# Patient Record
Sex: Male | Born: 1995
Health system: Southern US, Community
[De-identification: ages and names within clinical notes are randomized; demographics above are authoritative.]

---
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
## 2017-02-15 ENCOUNTER — Emergency Department
Admission: EM | Admit: 2017-02-15 | Discharge: 2017-02-16 | Disposition: A | Discharge status: Home / Self Care | Payer: Worker's Compensation | Attending: Emergency Medicine | Admitting: Emergency Medicine

## 2017-02-15 ENCOUNTER — Encounter: Payer: Self-pay | Admitting: *Deleted

## 2017-02-15 DIAGNOSIS — Y939 Activity, unspecified: Secondary | ICD-10-CM | POA: Diagnosis not present

## 2017-02-15 DIAGNOSIS — W231XXA Caught, crushed, jammed, or pinched between stationary objects, initial encounter: Secondary | ICD-10-CM | POA: Insufficient documentation

## 2017-02-15 DIAGNOSIS — Y999 Unspecified external cause status: Secondary | ICD-10-CM | POA: Insufficient documentation

## 2017-02-15 DIAGNOSIS — S67191A Crushing injury of left index finger, initial encounter: Secondary | ICD-10-CM | POA: Insufficient documentation

## 2017-02-15 DIAGNOSIS — S6710XA Crushing injury of unspecified finger(s), initial encounter: Secondary | ICD-10-CM

## 2017-02-15 DIAGNOSIS — Y929 Unspecified place or not applicable: Secondary | ICD-10-CM | POA: Insufficient documentation

## 2017-02-15 NOTE — ED Provider Notes (Signed)
[facility] Emergency Department Provider Note   ____________________________________________   First MD Initiated Contact with Patient 02/15/17 2359     (approximate)  I have reviewed the triage vital signs and the nursing notes.   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 Patient is right hand dominant.Complains of pain to his distal left index finger. Denies associated numbness/tingling. Patient voices no other complaints.   Past medical history None  There are no active problems to display for this patient.   No past surgical history on file.  Prior to Admission medications   Not on File    Allergies Patient has no known allergies.  No family history on file.  Social History Social History  Substance Use Topics  . Smoking status: Never Smoker  . Smokeless tobacco: Never Used  . Alcohol use No    Review of Systems  Constitutional: No fever/chills. Eyes: No visual changes. ENT: No sore throat. Cardiovascular: Denies chest pain. Respiratory: Denies shortness of breath. Gastrointestinal: No abdominal pain.  No nausea, no vomiting.  No diarrhea.  No constipation. Genitourinary: Negative for dysuria. Musculoskeletal: Positive for left finger pain. Negative for back pain. Skin: Negative for rash. Neurological: Negative for headaches, focal weakness or numbness.   ____________________________________________   PHYSICAL EXAM:  VITAL SIGNS: ED Triage Vitals  Enc Vitals Group     BP 02/15/17 2326 138/85     Pulse Rate 02/15/17 2326 67     Resp 02/15/17 2326 20     Temp 02/15/17 2326 98.6 F (37 C)     Temp Source 02/15/17 2326 Oral     SpO2 02/15/17 2326 99 %     Weight 02/15/17 2324 175 lb (79.4 kg)     Height 02/15/17 2324 6\' 1"  (1.854 m)   Head Circumference --      Peak Flow --      Pain Score 02/15/17 2323 4     Pain Loc --      Pain Edu? --      Excl. in GC? --     Constitutional: Alert and oriented. Well appearing and in no acute distress. Eyes: Conjunctivae are normal. PERRL. EOMI. Head: Atraumatic. Nose: No congestion/rhinnorhea. Mouth/Throat: Mucous membranes are moist.  Oropharynx non-erythematous. Neck: No stridor.  No cervical spine tenderness to palpation. Cardiovascular: Normal rate, regular rhythm. Grossly normal heart sounds.  Good peripheral circulation. Respiratory: Normal respiratory effort.  No retractions. Lungs CTAB. Gastrointestinal: Soft and nontender. No distention. No abdominal bruits. No CVA tenderness. Musculoskeletal:  Left index finger with slight reddening from distal IP joint to end of finger. Full range of motion with pain. Brisk, less than 5 second capillary refill. 2+ radial pulses. No nailbed injury or subungual hematoma. Neurologic:  Normal speech and language. No gross focal neurologic deficits are appreciated. No gait instability. Skin:  Skin is warm, dry and intact. No rash noted. Psychiatric: Mood and affect are normal. Speech and behavior are normal.  ____________________________________________   LABS (all labs ordered are listed, but only abnormal results are displayed)  Labs Reviewed - No data to display ____________________________________________  EKG  None ____________________________________________  RADIOLOGY  Left index finger x-ray interpreted per Dr. Cherly Hensenhang: No evidence of fracture or dislocation. ____________________________________________   PROCEDURES  Procedure(s) performed: None  Procedures  Critical  Care performed: No  ____________________________________________   INITIAL IMPRESSION / ASSESSMENT AND PLAN / ED COURSE  Pertinent labs & imaging results that were available during my care of the patient were reviewed by me and considered in my  medical decision making (see chart for details).  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 Will obtain x-ray and administer ibuprofen.  Clinical Course as of Feb 17 504  Thu Feb 16, 2017  [id number] Patient was updated on negative imaging results. Finger was splinted and he was discharged back to work in stable condition. Strict return precautions were given. Patient verbalized understanding and agreed with plan of care.  [JS]    Clinical Course User Index [JS] Irean Hong, MD     ____________________________________________   FINAL CLINICAL IMPRESSION(S) / ED DIAGNOSES  Final diagnoses:  Crushing injury of finger, initial encounter      NEW MEDICATIONS STARTED DURING THIS VISIT:  There are no discharge medications for this patient.    Note:  This document was prepared using Dragon voice recognition software and may include unintentional dictation errors.    Irean Hong, MD 02/16/17 [phone]
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
## 2017-02-16 ENCOUNTER — Emergency Department: Payer: Worker's Compensation

## 2017-02-16 MED ORDER — IBUPROFEN 800 MG PO TABS
800.0000 mg | ORAL_TABLET | Freq: Once | ORAL | Status: AC
  Administered 2017-02-16: 800 mg via ORAL
  Filled 2017-02-16: qty 1

## 2017-02-16 NOTE — Discharge Instructions (Signed)
Continue to wear splint as needed for comfort. Return to the ER for worsening symptoms, increased swelling or other concerns.

## 2017-05-03 ENCOUNTER — Emergency Department
Admission: EM | Admit: 2017-05-03 | Discharge: 2017-05-03 | Disposition: A | Discharge status: Home / Self Care | Payer: BLUE CROSS/BLUE SHIELD | Attending: Emergency Medicine | Admitting: Emergency Medicine

## 2017-05-03 ENCOUNTER — Encounter: Payer: Self-pay | Admitting: Emergency Medicine

## 2017-05-03 ENCOUNTER — Telehealth: Payer: Self-pay | Admitting: Emergency Medicine

## 2017-05-03 ENCOUNTER — Ambulatory Visit (HOSPITAL_COMMUNITY)
Admission: EM | Admit: 2017-05-03 | Discharge: 2017-05-03 | Disposition: A | Discharge status: Home / Self Care | Payer: No Typology Code available for payment source | Attending: Emergency Medicine | Admitting: Emergency Medicine

## 2017-05-03 DIAGNOSIS — Z0441 Encounter for examination and observation following alleged adult rape: Secondary | ICD-10-CM | POA: Insufficient documentation

## 2017-05-03 DIAGNOSIS — T7421XA Adult sexual abuse, confirmed, initial encounter: Secondary | ICD-10-CM

## 2017-05-03 DIAGNOSIS — T7621XA Adult sexual abuse, suspected, initial encounter: Secondary | ICD-10-CM | POA: Diagnosis present

## 2017-05-03 LAB — URINE DRUG SCREEN, QUALITATIVE (ARMC ONLY)
Amphetamines, Ur Screen: NOT DETECTED
Barbiturates, Ur Screen: NOT DETECTED
Benzodiazepine, Ur Scrn: NOT DETECTED
CANNABINOID 50 NG, UR ~~LOC~~: NOT DETECTED
COCAINE METABOLITE, UR ~~LOC~~: NOT DETECTED
MDMA (ECSTASY) UR SCREEN: NOT DETECTED
Methadone Scn, Ur: NOT DETECTED
Opiate, Ur Screen: NOT DETECTED
PHENCYCLIDINE (PCP) UR S: NOT DETECTED
Tricyclic, Ur Screen: NOT DETECTED

## 2017-05-03 MED ORDER — ONDANSETRON HCL 4 MG/2ML IJ SOLN
INTRAMUSCULAR | Status: AC
  Administered 2017-05-03: 4 mg via INTRAVENOUS
  Filled 2017-05-03: qty 2

## 2017-05-03 MED ORDER — SODIUM CHLORIDE 0.9 % IV BOLUS (SEPSIS)
1000.0000 mL | Freq: Once | INTRAVENOUS | Status: AC
Start: 2017-05-03 — End: 2017-05-03
  Administered 2017-05-03: 1000 mL via INTRAVENOUS

## 2017-05-03 MED ORDER — PROMETHAZINE HCL 25 MG PO TABS
25.0000 mg | ORAL_TABLET | Freq: Once | ORAL | Status: AC
  Administered 2017-05-03: 25 mg via ORAL
  Filled 2017-05-03: qty 1

## 2017-05-03 MED ORDER — ONDANSETRON HCL 4 MG/2ML IJ SOLN
4.0000 mg | Freq: Once | INTRAMUSCULAR | Status: AC
  Administered 2017-05-03: 4 mg via INTRAVENOUS

## 2017-05-03 MED ORDER — DIAZEPAM 5 MG PO TABS
5.0000 mg | ORAL_TABLET | Freq: Once | ORAL | Status: AC
  Administered 2017-05-03: 5 mg via ORAL
  Filled 2017-05-03: qty 1

## 2017-05-03 MED ORDER — IBUPROFEN 400 MG PO TABS
600.0000 mg | ORAL_TABLET | Freq: Once | ORAL | Status: AC
  Administered 2017-05-03: 600 mg via ORAL
  Filled 2017-05-03: qty 2

## 2017-05-03 MED ORDER — IBUPROFEN 400 MG PO TABS
ORAL_TABLET | ORAL | Status: AC
  Filled 2017-05-03: qty 1

## 2017-05-03 MED ORDER — ONDANSETRON HCL 4 MG/2ML IJ SOLN
INTRAMUSCULAR | Status: AC
  Filled 2017-05-03: qty 2

## 2017-05-03 NOTE — ED Notes (Signed)
[facility] done at bedside at this time. Pt states he feels like his HR is up at this time and c/o continued nausea. Pt requesting another liter of fluids.

## 2017-05-03 NOTE — ED Notes (Signed)
This RN to bedside with Sherilyn CooterHenry, RN. This RN introduced self to patient and family. Pt requesting something to drink and something for his headache. Pt noted to be resting in bed with family at bedside, respirations even and unlabored. This RN explained would speak with MD, pt states understanding. Will continue to monitor for further patient needs.
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
## 2017-05-03 NOTE — ED Notes (Signed)
Dr. Mayford KnifeWilliams to bedside at this time.
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
## 2017-05-03 NOTE — ED Notes (Signed)
SANE nurse is at bedside interviewing the Pt.

## 2017-05-03 NOTE — Telephone Encounter (Signed)
This RN notified by lab that incorrect tubes were drawn therefore all blood work that was sent down is unable to be run. Dr. Mayford KnifeWilliams notified, per Dr. Mayford KnifeWilliams, pt is to be contacted and given the option of returning for repeat lab draws. Will speak with Dixie DialsLiz G, RN regarding contacting patient to return for lab draws.
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
## 2017-05-03 NOTE — Telephone Encounter (Signed)
This RN attempted to call patient x 1 to return to ED for repeat blood work, left HIPAA compliant message on answering machine at [phone].
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
## 2017-05-16 LAB — BENZODIAZEPINES,MS,WB/SP RFX
7-Aminoclonazepam: NEGATIVE ng/mL
ALPRAZOLAM: NEGATIVE ng/mL
BENZODIAZEPINES CONFIRM: POSITIVE
CHLORDIAZEPOXIDE: NEGATIVE ng/mL
CLONAZEPAM: NEGATIVE ng/mL
DESMETHYLCHLORDIAZEPOXIDE: NEGATIVE ng/mL
Desalkylflurazepam: NEGATIVE ng/mL
Desmethyldiazepam: NEGATIVE ng/mL
Diazepam: 36 ng/mL
FLURAZEPAM: NEGATIVE ng/mL
LORAZEPAM: NEGATIVE ng/mL
Midazolam: NEGATIVE ng/mL
OXAZEPAM: NEGATIVE ng/mL
TEMAZEPAM: NEGATIVE ng/mL
Triazolam: NEGATIVE ng/mL

## 2017-05-16 LAB — DRUG SCREEN 10 W/CONF, SERUM
Amphetamines, IA: NEGATIVE ng/mL
Barbiturates, IA: NEGATIVE ug/mL
Benzodiazepines, IA: POSITIVE ng/mL
COCAINE & METABOLITE, IA: NEGATIVE ng/mL
Methadone, IA: NEGATIVE ng/mL
OXYCODONES, IA: NEGATIVE ng/mL
Opiates, IA: NEGATIVE ng/mL
PROPOXYPHENE, IA: NEGATIVE ng/mL
Phencyclidine, IA: NEGATIVE ng/mL
THC(Marijuana) Metabolite, IA: NEGATIVE ng/mL

## 2017-06-08 ENCOUNTER — Emergency Department
Admission: EM | Admit: 2017-06-08 | Discharge: 2017-06-08 | Disposition: A | Discharge status: Home / Self Care | Payer: Worker's Compensation | Attending: Emergency Medicine | Admitting: Emergency Medicine

## 2017-06-08 DIAGNOSIS — Z7721 Contact with and (suspected) exposure to potentially hazardous body fluids: Secondary | ICD-10-CM | POA: Diagnosis not present

## 2017-06-08 DIAGNOSIS — W461XXA Contact with contaminated hypodermic needle, initial encounter: Secondary | ICD-10-CM

## 2017-06-08 DIAGNOSIS — W460XXA Contact with hypodermic needle, initial encounter: Secondary | ICD-10-CM | POA: Diagnosis not present

## 2017-06-08 DIAGNOSIS — W268XXA Contact with other sharp object(s), not elsewhere classified, initial encounter: Secondary | ICD-10-CM | POA: Insufficient documentation

## 2017-06-08 NOTE — ED Provider Notes (Signed)
[facility] Emergency Department Provider Note   First MD Initiated Contact with Patient 06/08/17 [phone]     (approximate)  I have reviewed the triage vital signs and the nursing notes.   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 Patient was stuck on the left thumb   Past medical history None There are no active problems to display for this patient.   Past Surgical History None   Prior to Admission medications   Not on File    Allergies No Known Drug Allergies History reviewed. No pertinent family history.  Social History Social History  Substance Use Topics  . Smoking status: Never Smoker  . Smokeless tobacco: Never Used  . Alcohol use No    Review of Systems Constitutional: No fever/chills Eyes: No visual changes. ENT: No sore throat. Cardiovascular: Denies chest pain. Respiratory: Denies shortness of breath. Gastrointestinal: No abdominal pain.  No nausea, no vomiting.  No diarrhea.  No constipation. Genitourinary: Negative for dysuria. Musculoskeletal: Negative for neck pain.  Negative for back pain. Integumentary: Negative for rash. Neurological: Negative for headaches, focal weakness or numbness.   ____________________________________________   PHYSICAL EXAM:  VITAL SIGNS: ED Triage Vitals  Enc Vitals Group     BP 06/08/17 0203 (!) 127/92     Pulse Rate 06/08/17 0203 70     Resp 06/08/17 0203 18     Temp 06/08/17 0203 (!) 97.5 F (36.4 C)     Temp Source 06/08/17 0203 Oral     SpO2 06/08/17 0203 96 %     Weight --      Height --      Head Circumference --      Peak Flow --      Pain Score 06/08/17 0201 0     Pain Loc --      Pain Edu? --    Constitutional: Alert and oriented. Well appearing and in no acute  distress. Eyes: Conjunctivae are normal.  Head: Atraumatic. Mouth/Throat: Mucous membranes are moist.  Oropharynx non-erythematous. Neck: No stridor.  Cardiovascular: Normal rate, regular rhythm. Good peripheral circulation. Grossly normal heart sounds. Respiratory: Normal respiratory effort.  No retractions. Lungs CTAB. Musculoskeletal: No lower extremity tenderness nor edema. No gross deformities of extremities. Neurologic:  Normal speech and language. No gross focal neurologic deficits are appreciated.  Skin:  Skin is warm, dry and intact. No rash noted.     Procedures   ____________________________________________   INITIAL IMPRESSION / ASSESSMENT AND PLAN / ED COURSE  Pertinent labs & imaging results that were available during my care of the patient were reviewed by me and considered in my medical decision making (see chart for details).   spoke with the patient at length regarding infectious disease transmission via needle stick. Reviewed the owner of the needles rapid HIV which was negative as well as hepatitis C that was negative and 2016. Patient decided to forego prophylaxis at this time.     ____________________________________________  FINAL CLINICAL IMPRESSION(S) / ED DIAGNOSES  Final diagnoses:  Accidental needlestick injury with exposure to body fluid     MEDICATIONS GIVEN DURING THIS VISIT:  Medications - No data to display   NEW OUTPATIENT MEDICATIONS STARTED DURING THIS VISIT:  New Prescriptions   No medications on file    Modified Medications  No medications on file    Discontinued Medications   No medications on file     Note:  This document was prepared using Dragon voice recognition software and may include unintentional dictation errors.    Darci Current, MD 06/09/17 [phone]

## 2017-06-08 NOTE — ED Notes (Signed)
Lab [employer] urine collected for UDS per nursing supervisor request (degated to this tech via Press photographer) Signed out to [facility] lab following chain of custody protocol

## 2017-06-08 NOTE — ED Triage Notes (Signed)
W/C. 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

## 2017-06-08 NOTE — ED Notes (Signed)
Patient discharged to home per MD order. Patient in stable condition, and deemed medically cleared by ED provider for discharge. Discharge instructions reviewed with patient/family using "Teach Back"; verbalized understanding of medication education and administration, and information about follow-up care. Denies further concerns. ° °

## 2017-10-23 ENCOUNTER — Other Ambulatory Visit: Payer: Self-pay

## 2017-10-23 ENCOUNTER — Emergency Department
Admission: EM | Admit: 2017-10-23 | Discharge: 2017-10-24 | Disposition: A | Discharge status: Home / Self Care | Payer: BLUE CROSS/BLUE SHIELD | Attending: Emergency Medicine | Admitting: Emergency Medicine

## 2017-10-23 DIAGNOSIS — K529 Noninfective gastroenteritis and colitis, unspecified: Secondary | ICD-10-CM

## 2017-10-23 DIAGNOSIS — R112 Nausea with vomiting, unspecified: Secondary | ICD-10-CM | POA: Diagnosis present

## 2017-10-23 LAB — URINALYSIS, COMPLETE (UACMP) WITH MICROSCOPIC
Bacteria, UA: NONE SEEN
Bilirubin Urine: NEGATIVE
Glucose, UA: NEGATIVE mg/dL
Hgb urine dipstick: NEGATIVE
Ketones, ur: NEGATIVE mg/dL
Leukocytes, UA: NEGATIVE
Nitrite: NEGATIVE
PH: 7 (ref 5.0–8.0)
PROTEIN: NEGATIVE mg/dL
SQUAMOUS EPITHELIAL / LPF: NONE SEEN
Specific Gravity, Urine: 1.008 (ref 1.005–1.030)

## 2017-10-23 LAB — CBC
HCT: 39.9 % — ABNORMAL LOW (ref 40.0–52.0)
Hemoglobin: 13 g/dL (ref 13.0–18.0)
MCH: 26.5 pg (ref 26.0–34.0)
MCHC: 32.6 g/dL (ref 32.0–36.0)
MCV: 81.1 fL (ref 80.0–100.0)
PLATELETS: 173 10*3/uL (ref 150–440)
RBC: 4.92 MIL/uL (ref 4.40–5.90)
RDW: 12.7 % (ref 11.5–14.5)
WBC: 4 10*3/uL (ref 3.8–10.6)

## 2017-10-23 LAB — COMPREHENSIVE METABOLIC PANEL
ALBUMIN: 3.5 g/dL (ref 3.5–5.0)
ALK PHOS: 36 U/L — AB (ref 38–126)
ALT: 15 U/L — ABNORMAL LOW (ref 17–63)
AST: 18 U/L (ref 15–41)
Anion gap: 5 (ref 5–15)
BUN: 9 mg/dL (ref 6–20)
CALCIUM: 8.1 mg/dL — AB (ref 8.9–10.3)
CHLORIDE: 105 mmol/L (ref 101–111)
CO2: 29 mmol/L (ref 22–32)
CREATININE: 0.85 mg/dL (ref 0.61–1.24)
GFR calc Af Amer: >60 mL/min (ref >60)
GFR calc non Af Amer: >60 mL/min (ref >60)
GLUCOSE: 113 mg/dL — AB (ref 65–99)
Potassium: 3.6 mmol/L (ref 3.5–5.1)
SODIUM: 139 mmol/L (ref 135–145)
Total Bilirubin: 0.3 mg/dL (ref 0.3–1.2)
Total Protein: 6 g/dL — ABNORMAL LOW (ref 6.5–8.1)

## 2017-10-23 LAB — LIPASE, BLOOD: LIPASE: 24 U/L (ref 11–51)

## 2017-10-23 MED ORDER — PROMETHAZINE HCL 25 MG/ML IJ SOLN
INTRAMUSCULAR | Status: AC
  Filled 2017-10-23: qty 1

## 2017-10-23 MED ORDER — SODIUM CHLORIDE 0.9 % IV BOLUS (SEPSIS)
1000.0000 mL | Freq: Once | INTRAVENOUS | Status: AC
  Administered 2017-10-23: 1000 mL via INTRAVENOUS

## 2017-10-23 MED ORDER — PROMETHAZINE HCL 25 MG/ML IJ SOLN
25.0000 mg | Freq: Once | INTRAMUSCULAR | Status: AC
  Administered 2017-10-23: 25 mg via INTRAVENOUS

## 2017-10-23 MED ORDER — ONDANSETRON HCL 4 MG/2ML IJ SOLN
4.0000 mg | Freq: Once | INTRAMUSCULAR | Status: AC
  Administered 2017-10-23: 4 mg via INTRAVENOUS
  Filled 2017-10-23: qty 2

## 2017-10-23 MED ORDER — PROMETHAZINE HCL 25 MG PO TABS: 25.0000 mg | ORAL_TABLET | Freq: Four times a day (QID) | ORAL | 0 refills | Status: DC | PRN

## 2017-10-23 MED ORDER — ONDANSETRON 4 MG PO TBDP: 4.0000 mg | ORAL_TABLET | Freq: Three times a day (TID) | ORAL | 0 refills | Status: DC | PRN

## 2017-10-23 NOTE — ED Triage Notes (Signed)
Pt arrives to ED via [facility] with c/o of N&V&D. Started over the weekend. Had chickfila and about 6 hours later symptoms began. [place] in [place] in [place] and given 2 L NS and was DC. Was given immodium and phenergan but symptoms persist. Pt alert, oriented, appears fatigued. Pt states lost 12 lbs since Saturday when symptoms began.

## 2017-10-23 NOTE — ED Provider Notes (Signed)
[facility] Emergency Department Provider Note  Time seen: 10:10 PM  I have reviewed the triage vital signs and the nursing notes.   HISTORY  Chief Complaint Emesis and Diarrhea    HPI Jacob Stewart is a 22 y.o. male with no past medical history who presents to the emergency department for nausea vomiting diarrhea.  According to the patient 4 days ago he developed nausea vomiting diarrhea.  States he continues to feel nauseated but the vomiting is largely resolved but he continues to have extremely frequent episodes of very loose watery diarrhea.  Patient taking loperamide at home with minimal relief.  States abdominal cramping but denies any focal pain.  States 3 days ago he did have a fever to 101, does not believe he has had a fever since.  Patient states generalized fatigue and weakness feels like he is very dehydrated.  History reviewed. No pertinent past medical history.  There are no active problems to display for this patient.   History reviewed. No pertinent surgical history.  Prior to Admission medications   Not on File    No Known Allergies  History reviewed. No pertinent family history.  Social History Social History   Tobacco Use  . Smoking status: Never Smoker  . Smokeless tobacco: Never Used  Substance Use Topics  . Alcohol use: Yes  . Drug use: No    Review of Systems Constitutional: Fever 3 days ago. Eyes: Negative for visual complaints ENT: Negative for recent illness/congestion Cardiovascular: Negative for chest pain. Respiratory: Negative for shortness of breath. Gastrointestinal: Abdominal cramping.  Denies focal pain.  Positive for nausea vomiting diarrhea.  Watery diarrhea.  Denies blood. Genitourinary: Negative for urinary compaints Musculoskeletal: Negative for musculoskeletal complaints Skin: Negative for skin complaints  Neurological: Negative for headache All other ROS  negative  ____________________________________________   PHYSICAL EXAM:  VITAL SIGNS: ED Triage Vitals [10/23/17 2152]  Enc Vitals Group     BP 129/83     Pulse Rate 76     Resp 16     Temp 98.3 F (36.8 C)     Temp Source Oral     SpO2 98 %     Weight 172 lb (78 kg)     Height 6\' 1"  (1.854 m)     Head Circumference      Peak Flow      Pain Score 0     Pain Loc      Pain Edu?      Excl. in GC?    Constitutional: Alert and oriented.  Appears somewhat nauseated. Eyes: Normal exam ENT   Head: Normocephalic and atraumatic.   Mouth/Throat: Dry mucous membranes. Cardiovascular: Normal rate, regular rhythm. No murmur Respiratory: Normal respiratory effort without tachypnea nor retractions. Breath sounds are clear  Gastrointestinal: Soft, mild tenderness/cramping throughout.  No focal area of tenderness identified.  No rebound or guarding.  No distention. Musculoskeletal: Nontender with normal range of motion in all extremities.  Neurologic:  Normal speech and language. No gross focal neurologic deficits Skin:  Skin is warm, dry and intact.  Psychiatric: Mood and affect are normal.   ____________________________________________   INITIAL IMPRESSION / ASSESSMENT AND PLAN / ED COURSE  Pertinent labs & imaging results that were available during my care of the patient were reviewed by me and considered in my medical decision making (see chart for details).  Patient presents emergency department for nausea vomiting diarrhea ongoing for 4 days.  States the vomiting is largely resolved  but he continues to have very frequent episodes of watery diarrhea.  Differential would include gastroenteritis, viral gastroenteritis, bacterial gastroenteritis, C. difficile, colitis/diverticulitis.  We will check labs, IV hydrate, treat with Zofran and continue to closely monitor in the emergency department.  Will obtain a stool sample to check for C. 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  Patient cannot produce a stool sample in the emergency department.  Patient's labs are surprisingly reassuring, normal anion gap, no white blood cell count elevation, no ketones within the urine.  We will discharged with nausea medication, I discussed continuing to use loperamide 2 mg tablets after each loose bowel movement up to 4 times in a 24-hour period.  I also discussed supportive care at home including plenty of fluids, Tylenol or ibuprofen for discomfort and nausea medication as needed, as written for nausea.  Patient agreeable to this plan of care.  ____________________________________________   FINAL CLINICAL IMPRESSION(S) / ED DIAGNOSES  Nausea vomiting diarrhea Gastroenteritis   Minna AntisPaduchowski, Marylene Masek, MD 10/23/17 [phone]

## 2017-10-23 NOTE — ED Notes (Signed)
Pt given ginger ale, ok per Dr. Lenard LancePaduchowski.

## 2017-10-24 NOTE — ED Notes (Signed)
Pt taken to POV in wheelchair. VSS. NAD. Discharge instructions, RX, and follow up reviewed. All questions answered.

## 2018-06-05 IMAGING — DX DG FINGER INDEX 2+V*L*
3 series · 3 of 3 positions shown · non-contrast
Comparison: None.

CLINICAL DATA: Left index finger pain after mashing it between
stretcher and stretcher mount. Initial encounter.

EXAM:
LEFT INDEX FINGER 2+V

[finger ap]
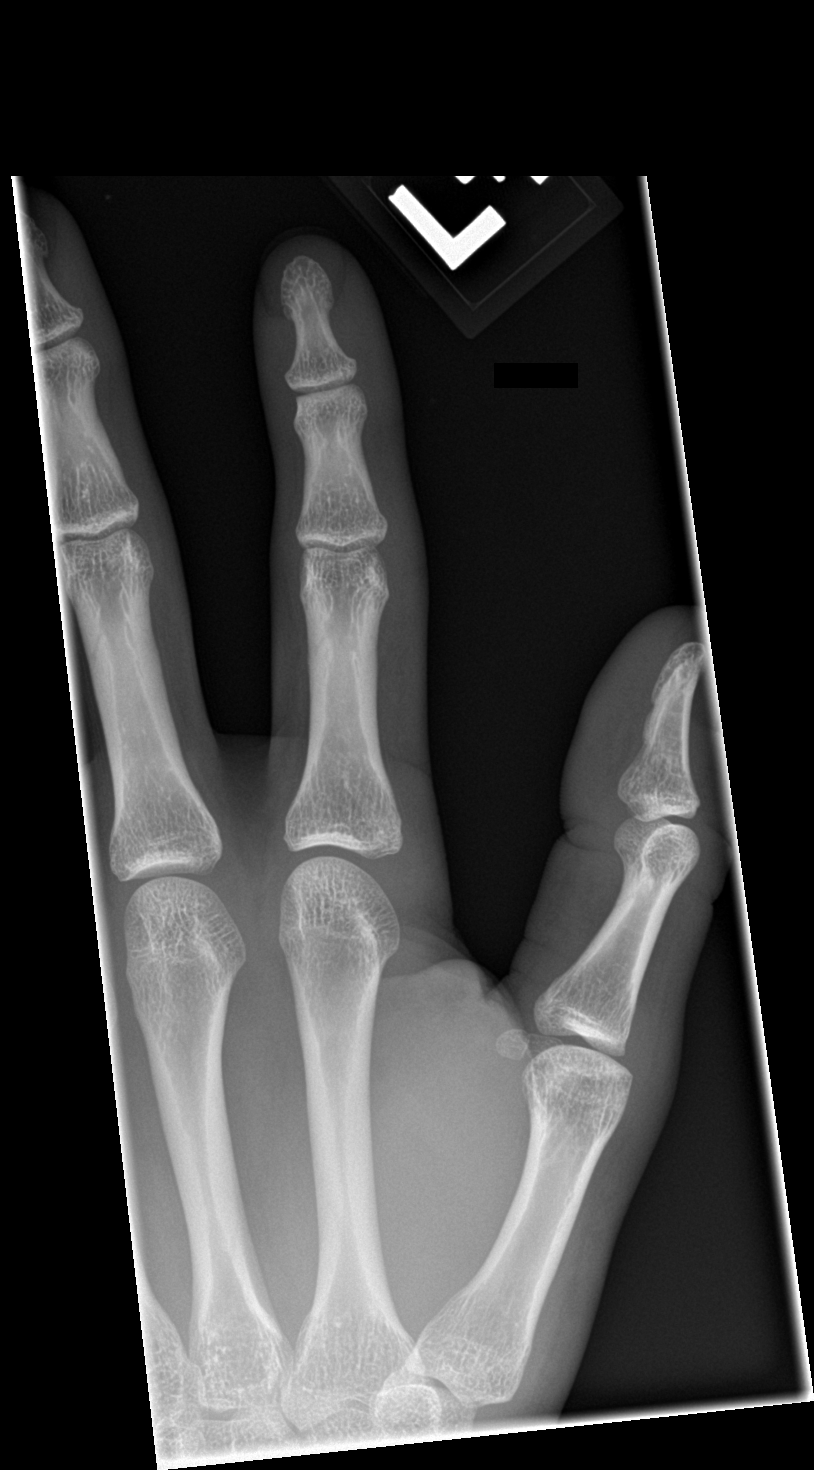

[finger obl]
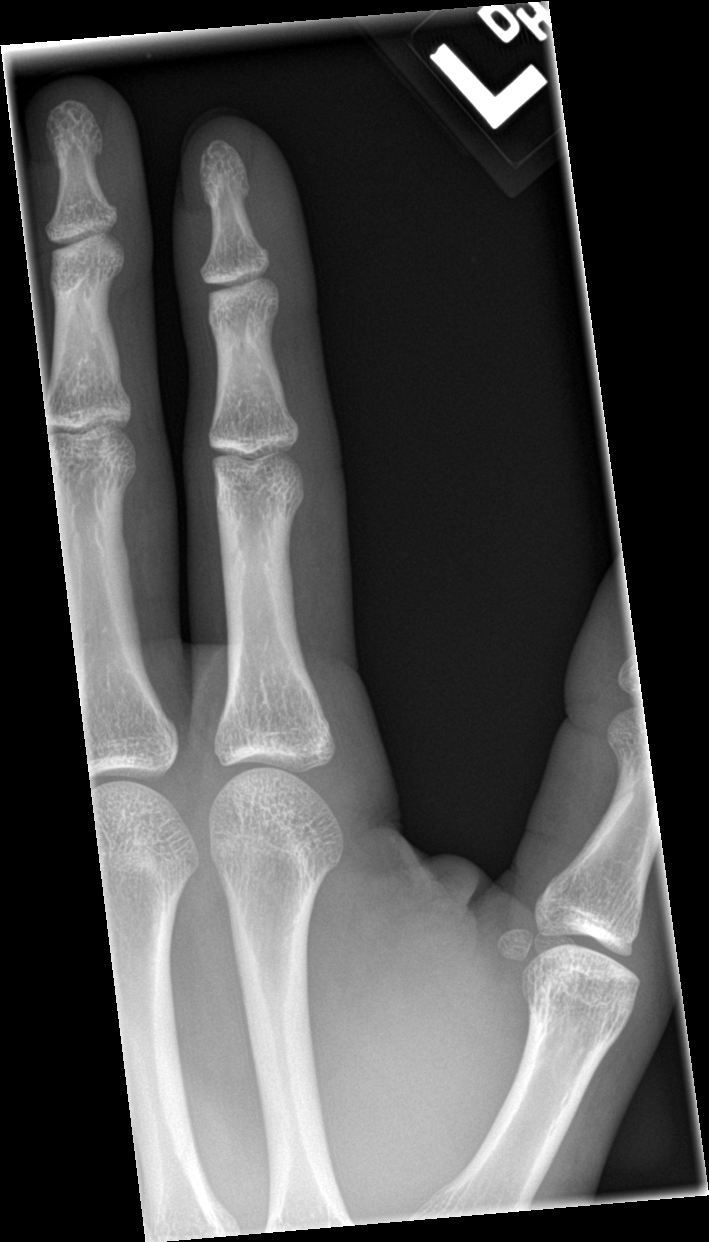

[finger lat]
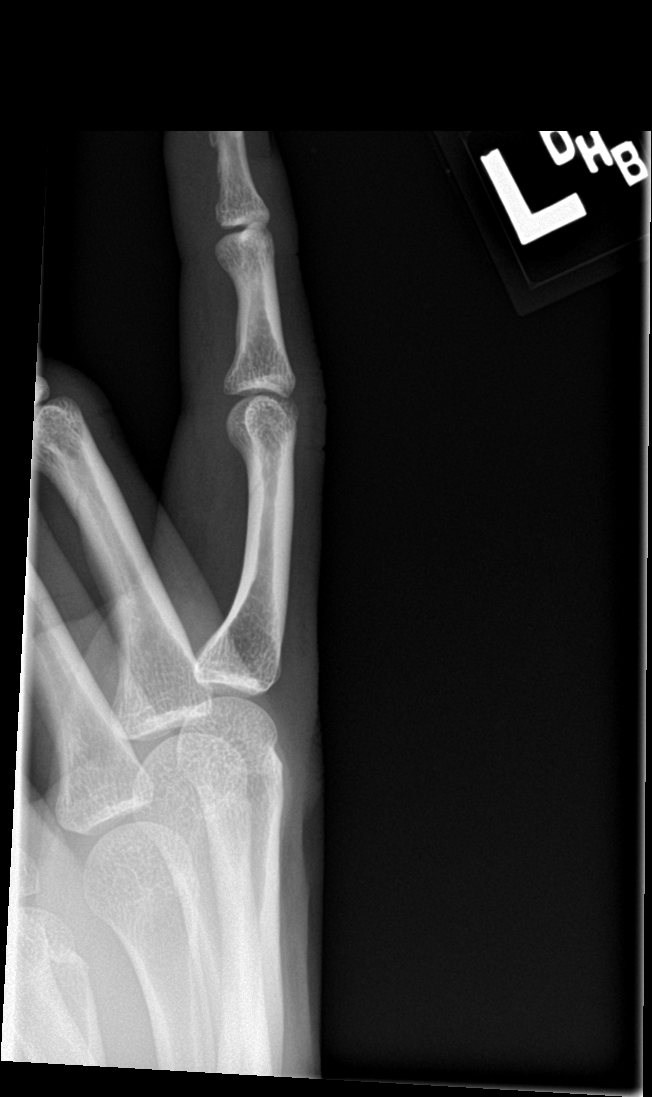

[3 of 3 positions shown; findings below may reference images not displayed]

FINDINGS: There is no evidence of fracture or dislocation. Visualized joint
spaces are preserved. No definite soft tissue abnormalities are
characterized on radiograph.
IMPRESSION: No evidence of fracture or dislocation.

## 2019-07-31 ENCOUNTER — Ambulatory Visit: Payer: Self-pay | Admitting: Family Medicine

## 2019-08-02 ENCOUNTER — Encounter: Payer: Self-pay | Admitting: Family Medicine

## 2019-08-02 ENCOUNTER — Other Ambulatory Visit: Payer: Self-pay

## 2019-08-02 ENCOUNTER — Ambulatory Visit: Payer: BC Managed Care – PPO | Admitting: Family Medicine

## 2019-08-02 VITALS — BP 110/76 | HR 72 | Temp 97.8°F | Resp 16 | Ht 72.0 in | Wt 175.9 lb

## 2019-08-02 DIAGNOSIS — Z23 Encounter for immunization: Secondary | ICD-10-CM | POA: Diagnosis not present

## 2019-08-02 DIAGNOSIS — F341 Dysthymic disorder: Secondary | ICD-10-CM | POA: Diagnosis not present

## 2019-08-02 DIAGNOSIS — Z7689 Persons encountering health services in other specified circumstances: Secondary | ICD-10-CM | POA: Diagnosis not present

## 2019-08-02 MED ORDER — ESCITALOPRAM OXALATE 5 MG PO TABS: ORAL_TABLET | ORAL | 1 refills | Status: AC

## 2019-08-02 NOTE — Addendum Note (Signed)
Addended by: Sia Gabrielsen G on: 08/02/2019 11:21 AM   Modules accepted: Orders

## 2019-08-02 NOTE — Progress Notes (Signed)
Name: Jacob Stewart   MRN: [id number]    DOB: 1996/08/26   Date:08/02/2019       Progress Note  Subjective  Chief Complaint  Chief Complaint  Patient presents with  . Establish Care    HPI  Pt presents to establish care and for the following:  Social: Was seeing Dr. Marolyn Haller with [facility] but he retired. He works Educational psychologist for [employer]  Lives in apartment with a friend who also works Educational psychologist.  No pets. He is planning to go to med school - has BA is exercise science BA in psychology and minor in [place] History.   Dysthymia: He has been having similar symptoms last year after graduating from [employer]. Struggled with the change in social structure from school to working. Also studying for MCAT.  Does have family history of depression - mother, younger brother.  Denies SI/HI.  Has used the EAP with [facility] in the past to work through some traumatic work incidents.  Is trying to go to the gym, taking care of his stress levels more proactively.  Diet is more balanced, staying hydrated.  His mom did not do well on SSRI therapy in the past, so he is hesitant to take SSRI therapy. No panic attacks, but does have higher anxiety days.  Patient Active Problem List   Diagnosis Date Noted  . Dysthymia 08/02/2019    History reviewed. No pertinent surgical history.  Family History  Problem Relation Age of Onset  . Depression Mother   . Hypertension Mother   . Diabetes type II Mother   . Hyperlipidemia Mother   . Depression Brother   . Healthy Father   . Hypertension Maternal Grandmother   . Hyperlipidemia Maternal Grandmother   . Diabetes type II Maternal Grandmother   . Healthy Brother     Social History   Socioeconomic History  . Marital status: Single    Spouse name: Not on file  . Number of children: Not on file  . Years of education: 16 years  . Highest education level: Not on file  Occupational History  . Not on file  Social Needs  . Financial resource strain:  Not hard at all  . Food insecurity    Worry: Never true    Inability: Never true  . Transportation needs    Medical: No    Non-medical: No  Tobacco Use  . Smoking status: Never Smoker  . Smokeless tobacco: Never Used  Substance and Sexual Activity  . Alcohol use: Yes  . Drug use: No  . Sexual activity: Not Currently    Partners: Male  Lifestyle  . Physical activity    Days per week: 4 days    Minutes per session: 60 min  . Stress: To some extent  Relationships  . Social connections    Talks on phone: More than three times a week    Gets together: Three times a week    Attends religious service: More than 4 times per year    Active member of club or organization: Yes    Attends meetings of clubs or organizations: More than 4 times per year    Relationship status: Never married  . Intimate partner violence    Fear of current or ex partner: No    Emotionally abused: No    Physically abused: No    Forced sexual activity: No  Other Topics Concern  . Not on file  Social History Narrative   [employer] for  med school pre requisitions in 2009. AT [employer] Taking online classes   Patient currently a Paramedic     Current Outpatient Medications:  Marland Kitchen  Melatonin 5 MG CAPS, Take by mouth., Disp: , Rfl:  .  escitalopram (LEXAPRO) 5 MG tablet, Take 1/2 tablet once daily for 7 days, then increase to 1 tablet once daily., Disp: 30 tablet, Rfl: 1  No Known Allergies  I personally reviewed active problem list, medication list, allergies, health maintenance, notes from last encounter, lab results with the patient/caregiver today.   ROS  Constitutional: Negative for fever or weight change.  Respiratory: Negative for cough and shortness of breath.   Cardiovascular: Negative for chest pain or palpitations.  Gastrointestinal: Negative for abdominal pain, no bowel changes.  Musculoskeletal: Negative for gait problem or joint swelling.  Skin: Negative for rash.  Neurological: Negative for  dizziness or headache.  No other specific complaints in a complete review of systems (except as listed in HPI above).  Objective  Vitals:   08/02/19 1031  BP: 110/76  Pulse: 72  Resp: 16  Temp: 97.8 F (36.6 C)  TempSrc: Temporal  SpO2: 99%  Weight: 175 lb 14.4 oz (79.8 kg)  Height: 6' (1.829 m)    Body mass index is 23.86 kg/m.  Physical Exam  Constitutional: Patient appears well-developed and well-nourished. No distress.  HENT: Head: Normocephalic and atraumatic.  Eyes: Conjunctivae and EOM are normal. No scleral icterus. Neck: Normal range of motion. Neck supple. No JVD present. No thyromegaly present.  Cardiovascular: Normal rate, regular rhythm and normal heart sounds.  No murmur heard. No BLE edema. Pulmonary/Chest: Effort normal and breath sounds normal. No respiratory distress. Musculoskeletal: Normal range of motion, no joint effusions. No gross deformities Neurological: Pt is alert and oriented to person, place, and time. No cranial nerve deficit. Coordination, balance, strength, speech and gait are normal.  Skin: Skin is warm and dry. No rash noted. No erythema.  Psychiatric: Patient has a normal mood and affect. behavior is normal. Judgment and thought content normal.  No results found for this or any previous visit (from the past 72 hour(s)).  PHQ2/9: Depression screen PHQ 2/9 08/02/2019  Decreased Interest 1  Down, Depressed, Hopeless 1  PHQ - 2 Score 2  Altered sleeping 3  Tired, decreased energy 1  Change in appetite 0  Feeling bad or failure about yourself  0  Trouble concentrating 1  Moving slowly or fidgety/restless 0  Suicidal thoughts 0  PHQ-9 Score 7  Difficult doing work/chores Not difficult at all   PHQ-2/9 Result is positive.    Fall Risk: Fall Risk  08/02/2019  Falls in the past year? 0  Number falls in past yr: 0  Injury with Fall? 0  Follow up Falls evaluation completed   Assessment & Plan  1. Dysthymia - escitalopram  (LEXAPRO) 5 MG tablet; Take 1/2 tablet once daily for 7 days, then increase to 1 tablet once daily.  Dispense: 30 tablet; Refill: 1  2. Encounter to establish care

## 2019-08-20 ENCOUNTER — Other Ambulatory Visit: Payer: Self-pay

## 2019-08-20 ENCOUNTER — Ambulatory Visit (INDEPENDENT_AMBULATORY_CARE_PROVIDER_SITE_OTHER): Payer: BC Managed Care – PPO | Admitting: Family Medicine

## 2019-08-20 ENCOUNTER — Encounter: Payer: Self-pay | Admitting: Family Medicine

## 2019-08-20 VITALS — BP 112/68 | HR 77 | Temp 97.8°F | Resp 18 | Ht 72.0 in | Wt 170.1 lb

## 2019-08-20 DIAGNOSIS — Z833 Family history of diabetes mellitus: Secondary | ICD-10-CM | POA: Diagnosis not present

## 2019-08-20 DIAGNOSIS — Z83438 Family history of other disorder of lipoprotein metabolism and other lipidemia: Secondary | ICD-10-CM

## 2019-08-20 DIAGNOSIS — Z113 Encounter for screening for infections with a predominantly sexual mode of transmission: Secondary | ICD-10-CM

## 2019-08-20 DIAGNOSIS — Z131 Encounter for screening for diabetes mellitus: Secondary | ICD-10-CM | POA: Diagnosis not present

## 2019-08-20 DIAGNOSIS — Z1159 Encounter for screening for other viral diseases: Secondary | ICD-10-CM

## 2019-08-20 DIAGNOSIS — Z0000 Encounter for general adult medical examination without abnormal findings: Secondary | ICD-10-CM | POA: Diagnosis not present

## 2019-08-20 DIAGNOSIS — Z13220 Encounter for screening for lipoid disorders: Secondary | ICD-10-CM

## 2019-08-20 NOTE — Patient Instructions (Signed)
Preventive Care 19-23 Years Old, Male Preventive care refers to lifestyle choices and visits with your health care provider that can promote health and wellness. This includes:  A yearly physical exam. This is also called an annual well check.  Regular dental and eye exams.  Immunizations.  Screening for certain conditions.  Healthy lifestyle choices, such as eating a healthy diet, getting regular exercise, not using drugs or products that contain nicotine and tobacco, and limiting alcohol use. What can I expect for my preventive care visit? Physical exam Your health care provider will check:  Height and weight. These may be used to calculate body mass index (BMI), which is a measurement that tells if you are at a healthy weight.  Heart rate and blood pressure.  Your skin for abnormal spots. Counseling Your health care provider may ask you questions about:  Alcohol, tobacco, and drug use.  Emotional well-being.  Home and relationship well-being.  Sexual activity.  Eating habits.  Work and work Statistician. What immunizations do I need?  Influenza (flu) vaccine  This is recommended every year. Tetanus, diphtheria, and pertussis (Tdap) vaccine  You may need a Td booster every 10 years. Varicella (chickenpox) vaccine  You may need this vaccine if you have not already been vaccinated. Human papillomavirus (HPV) vaccine  If recommended by your health care provider, you may need three doses over 6 months. Measles, mumps, and rubella (MMR) vaccine  You may need at least one dose of MMR. You may also need a second dose. Meningococcal conjugate (MenACWY) vaccine  One dose is recommended if you are 45-76 years old and a Market researcher living in a residence hall, or if you have one of several medical conditions. You may also need additional booster doses. Pneumococcal conjugate (PCV13) vaccine  You may need this if you have certain conditions and were not  previously vaccinated. Pneumococcal polysaccharide (PPSV23) vaccine  You may need one or two doses if you smoke cigarettes or if you have certain conditions. Hepatitis A vaccine  You may need this if you have certain conditions or if you travel or work in places where you may be exposed to hepatitis A. Hepatitis B vaccine  You may need this if you have certain conditions or if you travel or work in places where you may be exposed to hepatitis B. Haemophilus influenzae type b (Hib) vaccine  You may need this if you have certain risk factors. You may receive vaccines as individual doses or as more than one vaccine together in one shot (combination vaccines). Talk with your health care provider about the risks and benefits of combination vaccines. What tests do I need? Blood tests  Lipid and cholesterol levels. These may be checked every 5 years starting at age 17.  Hepatitis C test.  Hepatitis B test. Screening   Diabetes screening. This is done by checking your blood sugar (glucose) after you have not eaten for a while (fasting).  Sexually transmitted disease (STD) testing. Talk with your health care provider about your test results, treatment options, and if necessary, the need for more tests. Follow these instructions at home: Eating and drinking   Eat a diet that includes fresh fruits and vegetables, whole grains, lean protein, and low-fat dairy products.  Take vitamin and mineral supplements as recommended by your health care provider.  Do not drink alcohol if your health care provider tells you not to drink.  If you drink alcohol: ? Limit how much you have to 0-2  drinks a day. ? Be aware of how much alcohol is in your drink. In the [place] one drink equals one 12 oz bottle of beer (355 mL), one 5 oz glass of wine (148 mL), or one 1 oz glass of hard liquor (44 mL). Lifestyle  Take daily care of your teeth and gums.  Stay active. Exercise for at least 30 minutes on 5 or  more days each week.  Do not use any products that contain nicotine or tobacco, such as cigarettes, e-cigarettes, and chewing tobacco. If you need help quitting, ask your health care provider.  If you are sexually active, practice safe sex. Use a condom or other form of protection to prevent STIs (sexually transmitted infections). What's next?  Go to your health care provider once a year for a well check visit.  Ask your health care provider how often you should have your eyes and teeth checked.  Stay up to date on all vaccines. This information is not intended to replace advice given to you by your health care provider. Make sure you discuss any questions you have with your health care provider. Document Released: 11/01/2001 Document Revised: 08/30/2018 Document Reviewed: 08/30/2018 Elsevier Patient Education  2020 [employer]   

## 2019-08-20 NOTE — Progress Notes (Signed)
Name: Jacob Stewart   MRN: [id number]    DOB: 1996-05-10   Date:08/20/2019       Progress Note  Subjective  Chief Complaint  Chief Complaint  Patient presents with  . Annual Exam    HPI  Patient presents for annual CPE and brief follow up.  Dysthymia: Was started on low dose 5mg  Lexapro about 2 weeks prior.  He works Neurosurgeon for [employer], is studying for [employer], has also struggled with the social changes after completing his undergraduate degree.  He has healthy diet, exercises regularly, does not have panic attacks.  He reports having had nausea initially, but this has subsided.  He is taking the medication at night because it has been making him a bit drowsy.     Office Visit from 08/20/2019 in [facility]  PHQ-9 Total Score  5     USPSTF grade A and B recommendations:  Diet: Balanced Exercise: Exercissing regularly  Depression: phq 9 is positive Depression screen [facility] 2/9 08/20/2019 08/02/2019  Decreased Interest 1 1  Down, Depressed, Hopeless 1 1  PHQ - 2 Score 2 2  Altered sleeping 1 3  Tired, decreased energy 2 1  Change in appetite 0 0  Feeling bad or failure about yourself  0 0  Trouble concentrating 0 1  Moving slowly or fidgety/restless 0 0  Suicidal thoughts 0 0  PHQ-9 Score 5 7  Difficult doing work/chores Not difficult at all Not difficult at all    Hypertension:  BP Readings from Last 3 Encounters:  08/20/19 112/68  08/02/19 110/76  10/24/17 132/72    Obesity: Wt Readings from Last 3 Encounters:  08/20/19 170 lb 1.6 oz (77.2 kg)  08/02/19 175 lb 14.4 oz (79.8 kg)  10/23/17 172 lb (78 kg)   BMI Readings from Last 3 Encounters:  08/20/19 23.07 kg/m  08/02/19 23.86 kg/m  10/23/17 22.69 kg/m     Lipids:  No results found for: CHOL No results found for: HDL No results found for: LDLCALC No results found for: TRIG No results found for: CHOLHDL No results found for: LDLDIRECT Glucose:  Glucose  Date Value Ref Range  Status  09/28/2013 100 (H) 65 - 99 mg/dL Final   Glucose, Bld  Date Value Ref Range Status  10/23/2017 113 (H) 65 - 99 mg/dL Final      Office Visit from 08/20/2019 in [facility]  AUDIT-C Score  1    Occasionally - has a beer with dinner sometimes.  Single STD testing and prevention (HIV/chl/gon/syphilis): Has had 1 partner in the last year.  We will check today Hep C: We will check today  Skin cancer: No concerning lesions; discussed monitoring for atypical lesions. Colorectal cancer: Denies family or personal history of colorectal cancer, no changes in BM's - no blood in stool, dark and tarry stool, mucus in stool, or constipation/diarrhea. Prostate cancer: Paternal grandfather in his late 73's. Testicular Cancer: No concerns for abnormalities, no family history.   Lung cancer:  Never smoker. Low Dose CT Chest recommended if Age 42-80 years, 30 pack-year currently smoking OR have quit w/in 15years. Patient does not qualify.   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  Denies chest pain, shortness of breath, or palpitations.  Advanced Care Planning: A voluntary discussion about advance care planning including the explanation and discussion of advance directives.  Discussed health care proxy and Living will, and the patient was able to identify a health care proxy as Dad Camelia Eng).  Patient does not have a living will at present time. If patient does have living will, I have requested they bring this to the clinic to be scanned in to their chart.  Patient Active Problem List   Diagnosis Date Noted  . Dysthymia 08/02/2019   No past surgical history on file.  Family History  Problem Relation Age of Onset  . Depression Mother   . Hypertension  Mother   . Diabetes type II Mother   . Hyperlipidemia Mother   . Depression Brother   . Healthy Father   . Hypertension Maternal Grandmother   . Hyperlipidemia Maternal Grandmother   . Diabetes type II Maternal Grandmother   . Healthy Brother     Social History   Socioeconomic History  . Marital status: Single    Spouse name: Not on file  . Number of children: Not on file  . Years of education: 16 years  . Highest education level: Not on file  Occupational History  . Not on file  Social Needs  . Financial resource strain: Not hard at all  . Food insecurity    Worry: Never true    Inability: Never true  . Transportation needs    Medical: No    Non-medical: No  Tobacco Use  . Smoking status: Never Smoker  . Smokeless tobacco: Never Used  Substance and Sexual Activity  . Alcohol use: Yes  . Drug use: No  . Sexual activity: Not Currently    Partners: Male  Lifestyle  . Physical activity    Days per week: 4 days    Minutes per session: 60 min  . Stress: To some extent  Relationships  . Social connections    Talks on phone: More than three times a week    Gets together: Never    Attends religious service: More than 4 times per year    Active member of club or organization: Yes    Attends meetings of clubs or organizations: More than 4 times per year    Relationship status: Never married  . Intimate partner violence    Fear of current or ex partner: No    Emotionally abused: No    Physically abused: No    Forced sexual activity: No  Other Topics Concern  . Not on file  Social History Narrative   [employer] for med school pre requisitions in 2009. AT [employer] Taking online classes   Patient currently a Paramedic     Current Outpatient Medications:  .  escitalopram (LEXAPRO) 5 MG tablet, Take 1/2 tablet once daily for 7 days, then increase to 1 tablet once daily., Disp: 30 tablet, Rfl: 1 .  Melatonin 5 MG CAPS, Take by mouth., Disp: , Rfl:   No Known  Allergies   ROS  Constitutional: Negative for fever or weight change.  Respiratory: Negative for cough and shortness of breath.   Cardiovascular: Negative for chest pain or palpitations.  Gastrointestinal: Negative for abdominal pain, no bowel changes.  Musculoskeletal: Negative for gait problem or joint swelling.  Skin: Negative for rash.  Neurological: Negative for dizziness or headache.  No other specific complaints in a complete review of systems (except as listed in HPI above).  Objective  Vitals:   08/20/19  0803  BP: 112/68  Pulse: 77  Resp: 18  Temp: 97.8 F (36.6 C)  TempSrc: Temporal  SpO2: 99%  Weight: 170 lb 1.6 oz (77.2 kg)  Height: 6' (1.829 m)    Body mass index is 23.07 kg/m.  Physical Exam  Constitutional: Patient appears well-developed and well-nourished. No distress.  HENT: Head: Normocephalic and atraumatic. Ears: B TMs ok, no erythema or effusion; Nose: Nose normal. Mouth/Throat: Oropharynx is clear and moist. No oropharyngeal exudate.  Eyes: Conjunctivae and EOM are normal. Pupils are equal, round, and reactive to light. No scleral icterus.  Neck: Normal range of motion. Neck supple. No JVD present. No thyromegaly present.  Cardiovascular: Normal rate, regular rhythm and normal heart sounds.  No murmur heard. No BLE edema. Pulmonary/Chest: Effort normal and breath sounds normal. No respiratory distress. Abdominal: Soft. Bowel sounds are normal, no distension. There is no tenderness. no masses MALE GENITALIA: Deferred RECTAL: Deferred Musculoskeletal: Normal range of motion, no joint effusions. No gross deformities Neurological: he is alert and oriented to person, place, and time. No cranial nerve deficit. Coordination, balance, strength, speech and gait are normal.  Skin: Skin is warm and dry. No rash noted. No erythema.  Psychiatric: Patient has a normal mood and affect. behavior is normal. Judgment and thought content normal.  No results found  for this or any previous visit (from the past 2160 hour(s)).   PHQ2/9: [facility] 08/20/2019 08/02/2019  Decreased Interest 1 1  Down, Depressed, Hopeless 1 1  PHQ - 2 Score 2 2  Altered sleeping 1 3  Tired, decreased energy 2 1  Change in appetite 0 0  Feeling bad or failure about yourself  0 0  Trouble concentrating 0 1  Moving slowly or fidgety/restless 0 0  Suicidal thoughts 0 0  PHQ-9 Score 5 7  Difficult doing work/chores Not difficult at all Not difficult at all   Fall Risk: Fall Risk  08/20/2019 08/02/2019  Falls in the past year? 0 0  Number falls in past yr: 0 0  Injury with Fall? 0 0  Follow up Falls evaluation completed Falls evaluation completed    Assessment & Plan  1. Annual physical exam -Prostate cancer screening and PSA options (with potential risks and benefits of testing vs not testing) were discussed along with recent recs/guidelines. -USPSTF grade A and B recommendations reviewed with patient; age-appropriate recommendations, preventive care, screening tests, etc discussed and encouraged; healthy living encouraged; see AVS for patient education given to patient -Discussed importance of 150 minutes of physical activity weekly, eat two servings of fish weekly, eat one serving of tree nuts ( cashews, pistachios, pecans, almonds.Marland Kitchen.) every other day, eat 6 servings of fruit/vegetables daily and drink plenty of water and avoid sweet beverages.  - HIV Antibody (routine testing w rflx) - Hepatitis C antibody - RPR - Lipid panel - COMPLETE METABOLIC PANEL WITH GFR - Urine cytology ancillary only  2. Family history of diabetes mellitus - COMPLETE METABOLIC PANEL WITH GFR  3. Family history of hyperlipidemia - Lipid panel  4. Diabetes mellitus screening - COMPLETE METABOLIC PANEL WITH GFR  5. Lipid screening - Lipid panel  6. Routine screening for STI (sexually transmitted infection) - HIV Antibody (routine testing w rflx) - RPR - Urine  cytology ancillary only  7. Need for hepatitis C screening test - Hepatitis C antibody

## 2019-08-21 LAB — COMPLETE METABOLIC PANEL WITH GFR
AG Ratio: 2.3 (calc) (ref 1.0–2.5)
ALT: 14 U/L (ref 9–46)
AST: 16 U/L (ref 10–40)
Albumin: 5 g/dL (ref 3.6–5.1)
Alkaline phosphatase (APISO): 46 U/L (ref 36–130)
BUN: 14 mg/dL (ref 7–25)
CO2: 29 mmol/L (ref 20–32)
Calcium: 9.9 mg/dL (ref 8.6–10.3)
Chloride: 100 mmol/L (ref 98–110)
Creat: 1.04 mg/dL (ref 0.60–1.35)
GFR, Est African American: 117 mL/min/{1.73_m2} (ref >=60)
GFR, Est Non African American: 101 mL/min/{1.73_m2} (ref >=60)
Globulin: 2.2 g/dL (calc) (ref 1.9–3.7)
Glucose, Bld: 84 mg/dL (ref 65–99)
Potassium: 4.1 mmol/L (ref 3.5–5.3)
Sodium: 139 mmol/L (ref 135–146)
Total Bilirubin: 0.8 mg/dL (ref 0.2–1.2)
Total Protein: 7.2 g/dL (ref 6.1–8.1)

## 2019-08-21 LAB — URINE CYTOLOGY ANCILLARY ONLY
Chlamydia: NEGATIVE
Comment: NEGATIVE
Comment: NORMAL
Neisseria Gonorrhea: NEGATIVE

## 2019-08-21 LAB — LIPID PANEL
Cholesterol: 168 mg/dL (ref <200)
HDL: 72 mg/dL (ref >=40)
LDL Cholesterol (Calc): 82 mg/dL (calc)
Non-HDL Cholesterol (Calc): 96 mg/dL (calc) (ref <130)
Total CHOL/HDL Ratio: 2.3 (calc) (ref <5.0)
Triglycerides: 49 mg/dL (ref <150)

## 2019-08-21 LAB — HEPATITIS C ANTIBODY
Hepatitis C Ab: NONREACTIVE
SIGNAL TO CUT-OFF: 0.01 (ref <1.00)

## 2019-08-21 LAB — RPR: RPR Ser Ql: NONREACTIVE

## 2019-08-21 LAB — HIV ANTIBODY (ROUTINE TESTING W REFLEX): HIV 1&2 Ab, 4th Generation: NONREACTIVE

## 2019-10-02 ENCOUNTER — Encounter: Payer: Self-pay | Admitting: Family Medicine

## 2019-10-02 ENCOUNTER — Ambulatory Visit (INDEPENDENT_AMBULATORY_CARE_PROVIDER_SITE_OTHER): Payer: BC Managed Care – PPO | Admitting: Family Medicine

## 2019-10-02 ENCOUNTER — Other Ambulatory Visit: Payer: Self-pay

## 2019-10-02 DIAGNOSIS — Z91199 Patient's noncompliance with other medical treatment and regimen due to unspecified reason: Secondary | ICD-10-CM

## 2019-10-02 DIAGNOSIS — Z5329 Procedure and treatment not carried out because of patient's decision for other reasons: Secondary | ICD-10-CM

## 2019-10-02 NOTE — Progress Notes (Signed)
No show

## 2019-10-03 ENCOUNTER — Ambulatory Visit: Payer: BC Managed Care – PPO | Admitting: Family Medicine

## 2020-11-04 ENCOUNTER — Ambulatory Visit: Payer: BC Managed Care – PPO | Admitting: Dermatology

## 2020-11-04 ENCOUNTER — Other Ambulatory Visit: Payer: Self-pay

## 2020-11-04 DIAGNOSIS — B079 Viral wart, unspecified: Secondary | ICD-10-CM | POA: Diagnosis not present

## 2020-11-04 NOTE — Patient Instructions (Addendum)
Viral Warts & Molluscum Contagiosum  Viral warts and molluscum contagiosum are growths of the skin caused by viral infection of the skin. If you have been given the diagnosis of viral warts or molluscum contagiosum there are a few things that you must understand about your condition:  1. There is no guaranteed treatment method available for this condition. 2. Multiple treatments may be required, 3. The treatments may be time consuming and require multiple visits to the dermatology office. 4. The treatment may be expensive. You will be charged each time you come into the office to have the spots treated. 5. The treated areas may develop new lesions further complicating treatment. 6. The treated areas may leave a scar. 7. There is no guarantee that even after multiple treatments that the spots will be successfully treated. 8. These are caused by a viral infection and can be spread to other areas of the skin and to other people by direct contact. Therefore, new spots may occur.   Cryotherapy Aftercare  . Wash gently with soap and water everyday.   Marland Kitchen Apply Vaseline and Band-Aid daily until healed.   Cantharone Plus applied to warts today. Wash off in 6 hours. Once healed from cryotherapy, may used Salicylic acid to warts, but discontinue using a few days before next appointment.

## 2020-11-04 NOTE — Progress Notes (Signed)
   Follow-Up Visit   Subjective  Jacob Stewart is a 25 y.o. male who presents for the following: Warts (Left 5th finger. Has been treated in the past with cryotherapy. Wart cleared for the most part, but slowly grew back over time. He has recently used OTC salicylic acid. ).   The following portions of the chart were reviewed this encounter and updated as appropriate:       Review of Systems:  No other skin or systemic complaints except as noted in HPI or Assessment and Plan.  Objective  Well appearing patient in no apparent distress; mood and affect are within normal limits.  A focused examination was performed including hands. Relevant physical exam findings are noted in the Assessment and Plan.  Objective  Left 5th Finger Proximal nail fold and Periungual x 2, base of R index finger x 1 (3): 1.5cm Verrucous plaque with small adjacent verrucous papule -- Discussed viral etiology and contagion.    Assessment & Plan  Viral warts, unspecified type (3) Left 5th Finger Proximal nail fold and Periungual x 2, base of R index finger x 1  Discussed cryotherapy vs Candida injections. Will treat with cryotherapy today. If not improving, will switch to Candida injections.  Once healed from cryotherapy, may use Salicylic acid to warts, but discontinue a few days prior to next appointment.  Discussed viral etiology and risk of spread.  Discussed multiple treatments may be required to clear warts.  Discussed possible post-treatment dyspigmentation and risk of recurrence.   Destruction of lesion - Left 5th Finger Proximal nail fold and Periungual x 2, base of R index finger x 1  Destruction method: cryotherapy   Informed consent: discussed and consent obtained   Lesion destroyed using liquid nitrogen: Yes   Region frozen until ice ball extended beyond lesion: Yes   Outcome: patient tolerated procedure well with no complications   Post-procedure details: wound care instructions given    Additional details:  Prior to procedure, discussed risks of blister formation, small wound, skin dyspigmentation, or rare scar following cryotherapy.     Destruction of lesion - Left 5th Finger Proximal nail fold and Periungual x 2, base of R index finger x 1  Destruction method: chemical removal   Informed consent: discussed and consent obtained   Timeout:  patient name, date of birth, surgical site, and procedure verified Chemical destruction method: cantharidin   Application time:  6 hours Procedure instructions: patient instructed to wash and dry area   Outcome: patient tolerated procedure well with no complications   Post-procedure details: wound care instructions given   Additional details:  Wash off in 6 hours. Cantharone Plus Lot No. [id number] Exp 11/2021  Return in about 1 month (around 12/02/2020) for warts.   ICherlyn Labella, CMA, am acting as scribe for Willeen Niece, MD .  Documentation: I have reviewed the above documentation for accuracy and completeness, and I agree with the above.  Willeen Niece MD

## 2020-12-08 ENCOUNTER — Ambulatory Visit: Payer: BC Managed Care – PPO | Admitting: Dermatology

## 2021-08-05 DIAGNOSIS — R1032 Left lower quadrant pain: Secondary | ICD-10-CM | POA: Diagnosis not present

## 2021-08-31 ENCOUNTER — Other Ambulatory Visit: Payer: Self-pay

## 2021-08-31 ENCOUNTER — Emergency Department
Admission: EM | Admit: 2021-08-31 | Discharge: 2021-08-31 | Disposition: A | Discharge status: Home / Self Care | Payer: 59 | Attending: Emergency Medicine | Admitting: Emergency Medicine

## 2021-08-31 ENCOUNTER — Emergency Department: Payer: 59

## 2021-08-31 DIAGNOSIS — R109 Unspecified abdominal pain: Secondary | ICD-10-CM | POA: Diagnosis not present

## 2021-08-31 DIAGNOSIS — R1031 Right lower quadrant pain: Secondary | ICD-10-CM | POA: Diagnosis not present

## 2021-08-31 DIAGNOSIS — K529 Noninfective gastroenteritis and colitis, unspecified: Secondary | ICD-10-CM | POA: Diagnosis not present

## 2021-08-31 DIAGNOSIS — Z20822 Contact with and (suspected) exposure to covid-19: Secondary | ICD-10-CM | POA: Insufficient documentation

## 2021-08-31 DIAGNOSIS — R1110 Vomiting, unspecified: Secondary | ICD-10-CM | POA: Diagnosis not present

## 2021-08-31 LAB — RESP PANEL BY RT-PCR (FLU A&B, COVID) ARPGX2
Influenza A by PCR: NEGATIVE
Influenza B by PCR: NEGATIVE
SARS Coronavirus 2 by RT PCR: NEGATIVE

## 2021-08-31 LAB — COMPREHENSIVE METABOLIC PANEL
ALT: 38 U/L (ref 0–44)
AST: 33 U/L (ref 15–41)
Albumin: 4.4 g/dL (ref 3.5–5.0)
Alkaline Phosphatase: 57 U/L (ref 38–126)
Anion gap: 8 (ref 5–15)
BUN: 11 mg/dL (ref 6–20)
CO2: 29 mmol/L (ref 22–32)
Calcium: 9.4 mg/dL (ref 8.9–10.3)
Chloride: 101 mmol/L (ref 98–111)
Creatinine, Ser: 0.74 mg/dL (ref 0.61–1.24)
GFR, Estimated: >60 mL/min (ref >60)
Glucose, Bld: 94 mg/dL (ref 70–99)
Potassium: 3.9 mmol/L (ref 3.5–5.1)
Sodium: 138 mmol/L (ref 135–145)
Total Bilirubin: 0.7 mg/dL (ref 0.3–1.2)
Total Protein: 7.5 g/dL (ref 6.5–8.1)

## 2021-08-31 LAB — CBC
HCT: 45.9 % (ref 39.0–52.0)
Hemoglobin: 14.8 g/dL (ref 13.0–17.0)
MCH: 26.4 pg (ref 26.0–34.0)
MCHC: 32.2 g/dL (ref 30.0–36.0)
MCV: 82 fL (ref 80.0–100.0)
Platelets: 266 10*3/uL (ref 150–400)
RBC: 5.6 MIL/uL (ref 4.22–5.81)
RDW: 12 % (ref 11.5–15.5)
WBC: 4.2 10*3/uL (ref 4.0–10.5)
nRBC: 0 % (ref 0.0–0.2)

## 2021-08-31 LAB — LIPASE, BLOOD: Lipase: 37 U/L (ref 11–51)

## 2021-08-31 MED ORDER — ONDANSETRON HCL 4 MG/2ML IJ SOLN
4.0000 mg | Freq: Once | INTRAMUSCULAR | Status: AC | PRN
  Administered 2021-08-31: 4 mg via INTRAVENOUS
  Filled 2021-08-31: qty 2

## 2021-08-31 MED ORDER — ONDANSETRON 4 MG PO TBDP: 4.0000 mg | ORAL_TABLET | Freq: Three times a day (TID) | ORAL | 0 refills | Status: AC | PRN

## 2021-08-31 MED ORDER — IOHEXOL 300 MG/ML  SOLN
100.0000 mL | Freq: Once | INTRAMUSCULAR | Status: AC | PRN
  Administered 2021-08-31: 100 mL via INTRAVENOUS
  Filled 2021-08-31: qty 100

## 2021-08-31 MED ORDER — ONDANSETRON HCL 4 MG/2ML IJ SOLN: 4.0000 mg | Freq: Once | INTRAMUSCULAR | Status: DC

## 2021-08-31 MED ORDER — LACTATED RINGERS IV BOLUS
1000.0000 mL | Freq: Once | INTRAVENOUS | Status: AC
  Administered 2021-08-31: 1000 mL via INTRAVENOUS

## 2021-08-31 MED ORDER — CIPROFLOXACIN HCL 500 MG PO TABS: 500.0000 mg | ORAL_TABLET | Freq: Two times a day (BID) | ORAL | 0 refills | Status: AC

## 2021-08-31 MED ORDER — KETOROLAC TROMETHAMINE 30 MG/ML IJ SOLN
15.0000 mg | Freq: Once | INTRAMUSCULAR | Status: AC
  Administered 2021-08-31: 15 mg via INTRAVENOUS
  Filled 2021-08-31: qty 1

## 2021-08-31 NOTE — ED Notes (Signed)
Provided urinal. Pt denies further needs. Pt states pain is same.

## 2021-08-31 NOTE — Discharge Instructions (Addendum)
Results for orders placed or performed during the hospital encounter of 08/31/21  Resp Panel by RT-PCR (Flu A&B, Covid) Nasopharyngeal Swab   Specimen: Nasopharyngeal Swab; Nasopharyngeal(NP) swabs in vial transport medium  Result Value Ref Range   SARS Coronavirus 2 by RT PCR NEGATIVE NEGATIVE   Influenza A by PCR NEGATIVE NEGATIVE   Influenza B by PCR NEGATIVE NEGATIVE  Lipase, blood  Result Value Ref Range   Lipase 37 11 - 51 U/L  Comprehensive metabolic panel  Result Value Ref Range   Sodium 138 135 - 145 mmol/L   Potassium 3.9 3.5 - 5.1 mmol/L   Chloride 101 98 - 111 mmol/L   CO2 29 22 - 32 mmol/L   Glucose, Bld 94 70 - 99 mg/dL   BUN 11 6 - 20 mg/dL   Creatinine, Ser 5.36 0.61 - 1.24 mg/dL   Calcium 9.4 8.9 - 14.4 mg/dL   Total Protein 7.5 6.5 - 8.1 g/dL   Albumin 4.4 3.5 - 5.0 g/dL   AST 33 15 - 41 U/L   ALT 38 0 - 44 U/L   Alkaline Phosphatase 57 38 - 126 U/L   Total Bilirubin 0.7 0.3 - 1.2 mg/dL   GFR, Estimated >31 >54 mL/min   Anion gap 8 5 - 15  CBC  Result Value Ref Range   WBC 4.2 4.0 - 10.5 K/uL   RBC 5.60 4.22 - 5.81 MIL/uL   Hemoglobin 14.8 13.0 - 17.0 g/dL   HCT 00.8 67.6 - 19.5 %   MCV 82.0 80.0 - 100.0 fL   MCH 26.4 26.0 - 34.0 pg   MCHC 32.2 30.0 - 36.0 g/dL   RDW 09.3 26.7 - 12.4 %   Platelets 266 150 - 400 K/uL   nRBC 0.0 0.0 - 0.2 %   CT ABDOMEN PELVIS W CONTRAST  Result Date: 08/31/2021 CLINICAL DATA:  Right lower quadrant abdominal pain with 3 days of fever +nausea and vomiting. EXAM: CT ABDOMEN AND PELVIS WITH CONTRAST TECHNIQUE: Multidetector CT imaging of the abdomen and pelvis was performed using the standard protocol following bolus administration of intravenous contrast. CONTRAST:  OMNIPAQUE IOHEXOL 300 MG/ML  SOLN COMPARISON:  August 24, 2010 FINDINGS: Lower chest: No acute abnormality. Hepatobiliary: No suspicious hepatic lesion. Gallbladder is unremarkable. No biliary ductal dilation. Pancreas: No pancreatic ductal dilation or  evidence of acute inflammation. Spleen: Within normal limits. Adrenals/Urinary Tract: Adrenal glands are unremarkable. Kidneys are normal, without renal calculi, focal lesion, or hydronephrosis. Bladder is unremarkable. Stomach/Bowel: No enteric contrast was administered. Stomach is unremarkable for degree of distension. No pathologic dilation of small or large bowel. The appendix and terminal ileum appear normal. No suspicious colonic wall thickening or mass like lesions identified. Vascular/Lymphatic: No abdominal aortic aneurysm. No pathologically enlarged abdominal or pelvic lymph nodes. Reproductive: Prostate is unremarkable. Other: No significant abdominopelvic free fluid. No pneumoperitoneum. Musculoskeletal: No acute or significant osseous findings. IMPRESSION: No acute findings in the abdomen or pelvis. Specifically, normal appendix. Electronically Signed   By: Maudry Mayhew M.D.   On: 08/31/2021 11:36

## 2021-08-31 NOTE — Consult Note (Signed)
[facility] SURGICAL CONSULTATION NOTE (initial) - cpt: 16109   HISTORY OF PRESENT ILLNESS (HPI):  25 y.o. male presented to [facility] ED today for evaluation of abdominal pain. Patient reports a 3 day history of gradually progressing abdominal pain. He reports this was relatively diffuse at first but has since localized to his RLQ in the last 24 hours. This has been constant without any improvements. He reports associated nausea, diarrhea, and decreased appetite. Also reported fever at home since the onset of pain; T-max 101F. No chills, cough, CP, SOB, or urinary changes. He initially presented to urgent care but was referred to the ED. No history of similar. No previous intra-abdominal surgeries. Interestingly, he does report that he was diagnosed with E coli around Thanksgiving through stool studies. He was never treated with Abx. He did get better but since then has had two similar "episodes" like this in which he gets crampy abdominal pain and watery diarrhea. No blood in his stool. Work up in the ED revealed reassuring labs without leukocytosis, normal renal function, and no electrolyte derangements. He did have a CT Abdomen/Pelvis which did show a normal appearing appendix. There are a few loops of dilated fluid filled small bowel which appear more consistent with gastroenteritis.   Surgery is consulted by emergency medicine physician Dr. Sharman Cheek, MD in this context for evaluation and management of RLQ abdominal pain.  PAST MEDICAL HISTORY (PMH):  History reviewed. No pertinent past medical history.   PAST SURGICAL HISTORY (PSH):  History reviewed. No pertinent surgical history.   MEDICATIONS:  Prior to Admission medications   Medication Sig Start Date End Date Taking? Authorizing Provider  escitalopram (LEXAPRO) 5 MG tablet Take 1/2 tablet once daily for 7 days, then increase to 1 tablet once daily. 08/02/19   Doren Custard, FNP     ALLERGIES:  No Known Allergies    SOCIAL HISTORY:  Social History   Socioeconomic History   Marital status: Single    Spouse name: Not on file   Number of children: Not on file   Years of education: 16 years   Highest education level: Not on file  Occupational History   Not on file  Tobacco Use   Smoking status: Never   Smokeless tobacco: Never  Vaping Use   Vaping Use: Never used  Substance and Sexual Activity   Alcohol use: Yes   Drug use: No   Sexual activity: Not Currently    Partners: Male  Other Topics Concern   Not on file  Social History Narrative   [employer] for med school pre requisitions in 2009. AT [employer] Taking online classes   Patient currently a Radiation protection practitioner   Social Determinants of Health   Financial Resource Strain: Not on file  Food Insecurity: Not on file  Transportation Needs: Not on file  Physical Activity: Not on file  Stress: Not on file  Social Connections: Not on file  Intimate Partner Violence: Not on file     FAMILY HISTORY:  Family History  Problem Relation Age of Onset   Depression Mother    Hypertension Mother    Diabetes type II Mother    Hyperlipidemia Mother    Depression Brother    Colon polyps Father    Hypertension Maternal Grandmother    Hyperlipidemia Maternal Grandmother    Diabetes type II Maternal Grandmother    Healthy Brother    Prostate cancer Paternal Grandfather       REVIEW OF SYSTEMS:  Review  of Systems  Constitutional:  Positive for fever. Negative for chills.  HENT:  Negative for congestion and sore throat.   Respiratory:  Negative for cough and shortness of breath.   Cardiovascular:  Negative for chest pain and palpitations.  Gastrointestinal:  Positive for abdominal pain, diarrhea and nausea. Negative for constipation and vomiting.  Genitourinary:  Negative for dysuria and urgency.  All other systems reviewed and are negative.  VITAL SIGNS:  Temp:  [98.4 F (36.9 C)] 98.4 F (36.9 C) (12/13 1044) Pulse Rate:  [93] 93 (12/13  1044) Resp:  [18] 18 (12/13 1044) BP: (136)/(86) 136/86 (12/13 1044) SpO2:  [97 %] 97 % (12/13 1044) Weight:  [77.1 kg] 77.1 kg (12/13 1045)     Height: 6\' 1"  (185.4 cm) Weight: 77.1 kg BMI (Calculated): 22.43   INTAKE/OUTPUT:  No intake/output data recorded.  PHYSICAL EXAM:  Physical Exam Vitals and nursing note reviewed. Exam conducted with a chaperone present.  Constitutional:      General: He is not in acute distress.    Appearance: He is well-developed and normal weight. He is not ill-appearing.     Comments: Patient resting in bed, mother at bedside   HENT:     Head: Normocephalic and atraumatic.  Eyes:     General: No scleral icterus.    Extraocular Movements: Extraocular movements intact.  Cardiovascular:     Rate and Rhythm: Normal rate and regular rhythm.     Heart sounds: Normal heart sounds. No murmur heard. Pulmonary:     Effort: Pulmonary effort is normal. No respiratory distress.     Breath sounds: Normal breath sounds.  Abdominal:     General: Abdomen is flat. There is no distension.     Palpations: Abdomen is soft.     Tenderness: There is abdominal tenderness in the periumbilical area. There is no guarding or rebound. Negative signs include Rovsing's sign and McBurney's sign.     Comments: Abdomen is soft, he has some periumbilical soreness and lower right groin pain, no significant RLQ pain, no right flank pain which is where his appendix is anatomically located based on CT, negative McBurney's and Rovsing's, no rebound/guarding   Genitourinary:    Comments: Deferred Skin:    General: Skin is warm and dry.     Coloration: Skin is not jaundiced or pale.  Neurological:     General: No focal deficit present.     Mental Status: He is alert and oriented to person, place, and time.  Psychiatric:        Mood and Affect: Mood normal.        Behavior: Behavior normal.     Labs:  CBC Latest Ref Rng & Units 08/31/2021 10/23/2017 09/28/2013  WBC 4.0 - 10.5 K/uL 4.2  4.0 14.1(H)  Hemoglobin 13.0 - 17.0 g/dL 14.8 13.0 14.0  Hematocrit 39.0 - 52.0 % 45.9 39.9(L) 42.5  Platelets 150 - 400 K/uL 266 173 255   CMP Latest Ref Rng & Units 08/31/2021 08/20/2019 10/23/2017  Glucose 70 - 99 mg/dL 94 84 113(H)  BUN 6 - 20 mg/dL 11 14 9   Creatinine 0.61 - 1.24 mg/dL 0.74 1.04 0.85  Sodium 135 - 145 mmol/L 138 139 139  Potassium 3.5 - 5.1 mmol/L 3.9 4.1 3.6  Chloride 98 - 111 mmol/L 101 100 105  CO2 22 - 32 mmol/L 29 29 29   Calcium 8.9 - 10.3 mg/dL 9.4 9.9 8.1(L)  Total Protein 6.5 - 8.1 g/dL 7.5 7.2 6.0(L)  Total Bilirubin  0.3 - 1.2 mg/dL 0.7 0.8 0.3  Alkaline Phos 38 - 126 U/L 57 - 36(L)  AST 15 - 41 U/L 33 16 18  ALT 0 - 44 U/L 38 14 15(L)     Imaging studies:   CT Abdomen/Pelvis (08/31/2021) personally reviewed which shows a normal appearing retrocecal appendix, no surrounding inflammation, no abscess, no pneumoperitoneum, there are a few loops of dilated and fluid filled small bowel, and radiologist report reviewed below:  IMPRESSION: No acute findings in the abdomen or pelvis. Specifically, normal appendix.    Assessment/Plan: (ICD-10's: K52.9) 25 y.o. male presenting with abdominal pain, nausea, and watery diarrhea over the last three days. He does have a history of E coli infection the last month with similar episodes of diarrhea and abdominal discomfort since that time. We do feel his presentation may likely represent some degree of gastroenteritis. Appendicitis is certainly a possibility; however, this is felt to be less likely as his appendix is retrocecal by CT scan without any pain in this area on examination, no radiographic findings suggestive of appendicitis, and he is without leukocytosis. His labs are otherwise reassuring. At this time, would recommend considering treating for potential recurrent vs unresolved E coli gastroenteritis with Abx. He did get IVF resuscitation in the ED and with normal renal function I do not think he warrants admission  for IVF. No indication for surgical intervention at this time. The patient, and his mother at bedside, were educated on the signs and symptoms of appendicitis and encouraged to return to the ED if these develop or he fails to show any improvements. They were understanding and agreeable. All questions addressed and answered.   Thank you for the opportunity to participate in this patient's care.   -- Edison Simon, PA-C [facility] 08/31/2021, 12:44 PM [phone] M-F: 7am - 4pm

## 2021-08-31 NOTE — ED Provider Notes (Addendum)
[facility] Emergency Department Provider Note  ____________________________________________  Time seen: Approximately 12:23 PM  I have reviewed the triage vital signs and the nursing notes.   HISTORY  Chief Complaint Abdominal Pain    HPI Jacob Stewart is a 25 y.o. male with no significant past medical history who comes ED complaining of abdominal pain for the past 3 days, gradual onset, worsening, migrating to right lower quadrant, nonradiating, associated nausea diarrhea loss of appetite.  He is also had fever for the past few days.  Pain is moderate intensity, feels sharp, worse with movement.  He took Tylenol this morning.    History reviewed. No pertinent past medical history.   Patient Active Problem List   Diagnosis Date Noted   Dysthymia 08/02/2019     History reviewed. No pertinent surgical history.   Prior to Admission medications   Medication Sig Start Date End Date Taking? Authorizing Provider  ciprofloxacin (CIPRO) 500 MG tablet Take 1 tablet (500 mg total) by mouth 2 (two) times daily for 5 days. 08/31/21 09/05/21 Yes Sharman Cheek, MD  ondansetron (ZOFRAN-ODT) 4 MG disintegrating tablet Take 1 tablet (4 mg total) by mouth every 8 (eight) hours as needed for nausea or vomiting. 08/31/21  Yes Sharman Cheek, MD  escitalopram (LEXAPRO) 5 MG tablet Take 1/2 tablet once daily for 7 days, then increase to 1 tablet once daily. 08/02/19   Doren Custard, FNP     Allergies Patient has no known allergies.   Family History  Problem Relation Age of Onset   Depression Mother    Hypertension Mother    Diabetes type II Mother    Hyperlipidemia Mother    Depression Brother    Colon polyps Father    Hypertension Maternal Grandmother    Hyperlipidemia Maternal Grandmother    Diabetes type II Maternal Grandmother    Healthy Brother    Prostate cancer Paternal Grandfather     Social History Social History   Tobacco Use    Smoking status: Never   Smokeless tobacco: Never  Vaping Use   Vaping Use: Never used  Substance Use Topics   Alcohol use: Yes   Drug use: No    Review of Systems  Constitutional:   Positive fever.  ENT:   No sore throat. No rhinorrhea. Cardiovascular:   No chest pain or syncope. Respiratory:   No dyspnea or cough. Gastrointestinal:   Positive abdominal pain and diarrhea Musculoskeletal:   Negative for focal pain or swelling All other systems reviewed and are negative except as documented above in ROS and HPI.  ____________________________________________   PHYSICAL EXAM:  VITAL SIGNS: ED Triage Vitals  Enc Vitals Group     BP 08/31/21 1044 136/86     Pulse Rate 08/31/21 1044 93     Resp 08/31/21 1044 18     Temp 08/31/21 1044 98.4 F (36.9 C)     Temp Source 08/31/21 1044 Oral     SpO2 08/31/21 1044 97 %     Weight 08/31/21 1045 170 lb (77.1 kg)     Height 08/31/21 1045 6\' 1"  (1.854 m)     Head Circumference --      Peak Flow --      Pain Score 08/31/21 1045 7     Pain Loc --      Pain Edu? --      Excl. in GC? --     Vital signs reviewed, nursing assessments reviewed.   Constitutional:   Alert  and oriented. Non-toxic appearance. Eyes:   Conjunctivae are normal. EOMI.  ENT      Head:   Normocephalic and atraumatic.      Nose:   Wearing a mask.      Mouth/Throat:   Wearing a mask.      Neck:   No meningismus. Full ROM. Respiratory:   Normal respiratory effort without tachypnea/retractions. Gastrointestinal:   Soft with focal right lower quadrant tenderness. Non distended. No rebound, rigidity, or guarding.  Musculoskeletal:   Normal range of motion in all extremities. No joint effusions.  No lower extremity tenderness.  No edema. Neurologic:   Normal speech and language.  Motor grossly intact. No acute focal neurologic deficits are appreciated.    ____________________________________________    LABS (pertinent positives/negatives) (all labs ordered  are listed, but only abnormal results are displayed) Labs Reviewed  RESP PANEL BY RT-PCR (FLU A&B, COVID) ARPGX2  LIPASE, BLOOD  COMPREHENSIVE METABOLIC PANEL  CBC  URINALYSIS, ROUTINE W REFLEX MICROSCOPIC   ____________________________________________   EKG    ____________________________________________    RADIOLOGY  CT ABDOMEN PELVIS W CONTRAST  Result Date: 08/31/2021 CLINICAL DATA:  Right lower quadrant abdominal pain with 3 days of fever +nausea and vomiting. EXAM: CT ABDOMEN AND PELVIS WITH CONTRAST TECHNIQUE: Multidetector CT imaging of the abdomen and pelvis was performed using the standard protocol following bolus administration of intravenous contrast. CONTRAST:  OMNIPAQUE IOHEXOL 300 MG/ML  SOLN COMPARISON:  August 24, 2010 FINDINGS: Lower chest: No acute abnormality. Hepatobiliary: No suspicious hepatic lesion. Gallbladder is unremarkable. No biliary ductal dilation. Pancreas: No pancreatic ductal dilation or evidence of acute inflammation. Spleen: Within normal limits. Adrenals/Urinary Tract: Adrenal glands are unremarkable. Kidneys are normal, without renal calculi, focal lesion, or hydronephrosis. Bladder is unremarkable. Stomach/Bowel: No enteric contrast was administered. Stomach is unremarkable for degree of distension. No pathologic dilation of small or large bowel. The appendix and terminal ileum appear normal. No suspicious colonic wall thickening or mass like lesions identified. Vascular/Lymphatic: No abdominal aortic aneurysm. No pathologically enlarged abdominal or pelvic lymph nodes. Reproductive: Prostate is unremarkable. Other: No significant abdominopelvic free fluid. No pneumoperitoneum. Musculoskeletal: No acute or significant osseous findings. IMPRESSION: No acute findings in the abdomen or pelvis. Specifically, normal appendix. Electronically Signed   By: Maudry Mayhew M.D.   On: 08/31/2021 11:36     ____________________________________________   PROCEDURES Procedures  ____________________________________________  DIFFERENTIAL DIAGNOSIS   Appendicitis, viral illness, kidney stone, diverticulitis  CLINICAL IMPRESSION / ASSESSMENT AND PLAN / ED COURSE  Medications ordered in the ED: Medications  ondansetron (ZOFRAN) injection 4 mg (4 mg Intravenous Given 08/31/21 1054)  ketorolac (TORADOL) 30 MG/ML injection 15 mg (15 mg Intravenous Given 08/31/21 1106)  lactated ringers bolus 1,000 mL (0 mLs Intravenous Stopped 08/31/21 1234)  iohexol (OMNIPAQUE) 300 MG/ML solution 100 mL (100 mLs Intravenous Contrast Given 08/31/21 1126)    Pertinent labs & imaging results that were available during my care of the patient were reviewed by me and considered in my medical decision making (see chart for details).  Olden Klauer was evaluated in Emergency Department on 08/31/2021 for the symptoms described in the history of present illness. He was evaluated in the context of the global COVID-19 pandemic, which necessitated consideration that the patient might be at risk for infection with the SARS-CoV-2 virus that causes COVID-19. Institutional protocols and algorithms that pertain to the evaluation of patients at risk for COVID-19 are in a state of rapid change based on information released  by regulatory bodies including the CDC and federal and state organizations. These policies and algorithms were followed during the patient's care in the ED.   Patient presents with abdominal pain most suspicious for appendicitis.  Vital signs are normal though fever may be masked by recent Tylenol use.  Not septic.  Not peritoneal.  Lab panel is normal.  CT scan is normal, normal appendix.  With the patient's right lower quadrant tenderness and symptom progression suspicious for appendicitis, I will consult general surgery for evaluation.  ----------------------------------------- 2:20 PM on  08/31/2021 ----------------------------------------- D/w surgery, who feels exam is not c/w appy.  Pain is controlled. Pt has had waxing/waning similar sx  x 3 weeks, reports being diagnosed with an e coli infection a few weeks ago. At this point, with duration of sx and fever, will treat with cipro x 5 days.     ____________________________________________   FINAL CLINICAL IMPRESSION(S) / ED DIAGNOSES    Final diagnoses:  RLQ abdominal pain  Enteritis     ED Discharge Orders          Ordered    ciprofloxacin (CIPRO) 500 MG tablet  2 times daily        08/31/21 1416    ondansetron (ZOFRAN-ODT) 4 MG disintegrating tablet  Every 8 hours PRN        08/31/21 1416            Portions of this note were generated with dragon dictation software. Dictation errors may occur despite best attempts at proofreading.    Sharman Cheek, MD 08/31/21 1226    Sharman Cheek, MD 08/31/21 [phone]

## 2021-08-31 NOTE — ED Triage Notes (Signed)
First Nurse Note:  Sent to ED from Urgent Care. Patient states "They told me I have appendicitis".  AAOx3.  Skin warm and dry. NAD

## 2021-08-31 NOTE — ED Notes (Signed)
Patient transported to CT via WC with CT tech. 

## 2021-08-31 NOTE — ED Notes (Signed)
Pt to ED for fever, RLQ abdominal pain. Pt had fever of 116f over weekend with cramping abdominal pain over whole abdomen and diarrhea that all started 3 days ago. Pain localized to RLQ last night and is now sharp. Pt does not feel well. Accompanied by mother.

## 2021-08-31 NOTE — ED Triage Notes (Signed)
Pt to ED for RLQ pain x3 days with fevers. +nausea, diarrhea

## 2022-06-02 ENCOUNTER — Encounter: Payer: Self-pay | Admitting: Dermatology

## 2022-06-02 ENCOUNTER — Ambulatory Visit: Payer: BC Managed Care – PPO | Admitting: Dermatology

## 2022-06-02 DIAGNOSIS — B078 Other viral warts: Secondary | ICD-10-CM

## 2022-06-02 NOTE — Progress Notes (Unsigned)
Follow-Up Visit   Subjective  Jacob Stewart is a 26 y.o. male who presents for the following: Warts (L-4, 5 fingers and thumb, R-3 finger. Has had right finger treated in the past. Spreading. Hx of HPV vaccine as teenager).  The following portions of the chart were reviewed this encounter and updated as appropriate:  Tobacco  Allergies  Meds  Problems  Med Hx  Surg Hx  Fam Hx      Review of Systems: No other skin or systemic complaints except as noted in HPI or Assessment and Plan.   Objective  Well appearing patient in no apparent distress; mood and affect are within normal limits.  A focused examination was performed including hands/fingers. Relevant physical exam findings are noted in the Assessment and Plan.  L-5 finger x4 (1 subungual), L-4 finger proximal nail fold x1, L-2 finger proximal nail fold x1, R-3 finger x1, left thumb subungual x2 (9) Verrucous papules -- Discussed viral etiology and contagion.    Assessment & Plan  Other viral warts (9) L-5 finger x4 (1 subungual), L-4 finger proximal nail fold x1, L-2 finger proximal nail fold x1, R-3 finger x1, left thumb subungual x2  Discussed viral etiology and risk of spread.  Discussed multiple treatments may be required to clear warts.  Discussed possible post-treatment dyspigmentation and risk of recurrence.  Squaric Acid 3% applied to left upper inner arm and covered with band aid. Patient has topical steroid available to treat topically if rash occurs.    Cantharidin Plus is a blistering agent that comes from a beetle.  It needs to be washed off in about 4 hours after application.  Although it is painless when applied in office, it may cause symptoms of mild pain and burning several hours later.  Treated areas will swell and turn red, and blisters may form.  Vaseline and a bandaid may be applied until wound has healed.  Once healed, the skin may remain temporarily discolored.  It can take weeks to months for  pigmentation to return to normal.  Advised to wash off with soap and water in 4 hours or sooner if it becomes tender before then.  WartPEEL is a special compounded prescription medicine (5-fluorouracil/salicylic acid) used to treat warts. It works much faster than over the counter salicylic acid but is not paid for by insurance. It should be applied to the wart once a day and covered with a bandage. Care should be taken to avoid applying it to the normal skin surrounding the wart in order to avoid irritation. It should not be used by pregnant women.     Consider adding candida antigen at follow-up  Destruction of lesion - L-5 finger x4 (1 subungual), L-4 finger proximal nail fold x1, L-2 finger proximal nail fold x1, R-3 finger x1, left thumb subungual x2  Destruction method: cryotherapy   Informed consent: discussed and consent obtained   Lesion destroyed using liquid nitrogen: Yes   Region frozen until ice ball extended beyond lesion: Yes   Outcome: patient tolerated procedure well with no complications   Post-procedure details: wound care instructions given   Additional details:  Prior to procedure, discussed risks of blister formation, small wound, skin dyspigmentation, or rare scar following cryotherapy. Recommend Vaseline ointment to treated areas while healing.   Destruction of lesion - L-5 finger x4 (1 subungual), L-4 finger proximal nail fold x1, L-2 finger proximal nail fold x1, R-3 finger x1, left thumb subungual x2  Destruction method: chemical removal  Informed consent: discussed and consent obtained   Timeout:  patient name, date of birth, surgical site, and procedure verified Chemical destruction method comment:  Cantharadin plus Procedure instructions: patient instructed to wash and dry area   Procedure instructions comment:  Wash off in 4-6 hours Outcome: patient tolerated procedure well with no complications   Post-procedure details: wound care instructions given    Additional details:  Squaric Acid 3% applied to warts today. Prior to application reviewed risk of inflammation and irritation.   Cantharidin Plus is a blistering agent that comes from a beetle.  It needs to be washed off in about 4 hours after application.  Although it is painless when applied in office, it may cause symptoms of mild pain and burning several hours later.  Treated areas will swell and turn red, and blisters may form.  Vaseline and a bandaid may be applied until wound has healed.  Once healed, the skin may remain temporarily discolored.  It can take weeks to months for pigmentation to return to normal.  Advised to wash off with soap and water in 4 hours or sooner if it becomes tender before then.    Return for Wart Follow UP 3-4 weeks.  I, Lawson Radar, CMA, am acting as scribe for Darden Dates, MD.  Documentation: I have reviewed the above documentation for accuracy and completeness, and I agree with the above.  Darden Dates, MD

## 2022-06-02 NOTE — Patient Instructions (Addendum)
Cryotherapy Aftercare  Wash gently with soap and water everyday.   Apply Vaseline and Band-Aid daily until healed.    Instructions for After In-Office Application of Cantharidin  1. This is a strong medicine; please follow ALL instructions.  2. Gently wash off with soap and water in four hours or sooner s directed by your physician.  3. **WARNING** this medicine can cause severe blistering, blood blisters, infection, and/or scarring if it is not washed off as directed.  4. Your progress will be rechecked in 1-2 months; call sooner if there are any questions or problems.  Cantharidin Plus is a blistering agent that comes from a beetle.  It needs to be washed off in about 4 hours after application.  Although it is painless when applied in office, it may cause symptoms of mild pain and burning several hours later.  Treated areas will swell and turn red, and blisters may form.  Vaseline and a bandaid may be applied until wound has healed.  Once healed, the skin may remain temporarily discolored.  It can take weeks to months for pigmentation to return to normal.  Advised to wash off with soap and water in 4 hours or sooner if it becomes tender before then.    WartPEEL is a special compounded prescription medicine (5-fluorouracil/salicylic acid) used to treat warts. It works much faster than over the counter salicylic acid but is not paid for by insurance. It should be applied to the wart once a day and covered with a bandage. Care should be taken to avoid applying it to the normal skin surrounding the wart in order to avoid irritation. It should not be used by pregnant women.      Due to recent changes in healthcare laws, you may see results of your pathology and/or laboratory studies on MyChart before the doctors have had a chance to review them. We understand that in some cases there may be results that are confusing or concerning to you. Please understand that not all results are received at the  same time and often the doctors may need to interpret multiple results in order to provide you with the best plan of care or course of treatment. Therefore, we ask that you please give [place] 2 business days to thoroughly review all your results before contacting the office for clarification. Should we see a critical lab result, you will be contacted sooner.   If You Need Anything After Your Visit  If you have any questions or concerns for your doctor, please call our main line at [phone] and press option 4 to reach your doctor's medical assistant. If no one answers, please leave a voicemail as directed and we will return your call as soon as possible. Messages left after 4 pm will be answered the following business day.   You may also send [place] a message via MyChart. We typically respond to MyChart messages within 1-2 business days.  For prescription refills, please ask your pharmacy to contact our office. Our fax number is [phone].  If you have an urgent issue when the clinic is closed that cannot wait until the next business day, you can page your doctor at the number below.    Please note that while we do our best to be available for urgent issues outside of office hours, we are not available 24/7.   If you have an urgent issue and are unable to reach [place], you may choose to seek medical care at your doctor's office, retail  clinic, urgent care center, or emergency room.  If you have a medical emergency, please immediately call 911 or go to the emergency department.  Pager Numbers  - Dr. Gwen Pounds: [phone]  - Dr. Neale Burly: [phone]  - Dr. Roseanne Reno: [phone]  In the event of inclement weather, please call our main line at [phone] for an update on the status of any delays or closures.  Dermatology Medication Tips: Please keep the boxes that topical medications come in in order to help keep track of the instructions about where and how to use these. Pharmacies typically  print the medication instructions only on the boxes and not directly on the medication tubes.   If your medication is too expensive, please contact our office at [phone] option 4 or send [place] a message through MyChart.   We are unable to tell what your co-pay for medications will be in advance as this is different depending on your insurance coverage. However, we may be able to find a substitute medication at lower cost or fill out paperwork to get insurance to cover a needed medication.   If a prior authorization is required to get your medication covered by your insurance company, please allow [place] 1-2 business days to complete this process.  Drug prices often vary depending on where the prescription is filled and some pharmacies may offer cheaper prices.  The website www.goodrx.com contains coupons for medications through different pharmacies. The prices here do not account for what the cost may be with help from insurance (it may be cheaper with your insurance), but the website can give you the price if you did not use any insurance.  - You can print the associated coupon and take it with your prescription to the pharmacy.  - You may also stop by our office during regular business hours and pick up a GoodRx coupon card.  - If you need your prescription sent electronically to a different pharmacy, notify our office through [facility] or by phone at [phone] option 4.     Si Usted Necesita Algo Despus de Su Visita  Tambin puede enviarnos un mensaje a travs de Clinical cytogeneticist. Por lo general respondemos a los mensajes de MyChart en el transcurso de 1 a 2 das hbiles.  Para renovar recetas, por favor pida a su farmacia que se ponga en contacto con nuestra oficina. Annie Sable de fax es [place] [phone].  Si tiene un asunto urgente cuando la clnica est cerrada y que no puede esperar hasta el siguiente da hbil, puede llamar/localizar a su doctor(a) al nmero que aparece a  continuacin.   Por favor, tenga en cuenta que aunque hacemos todo lo posible para estar disponibles para asuntos urgentes fuera del horario de [place], no estamos disponibles las 24 horas del da, los 7 [address]  [address] de la [place].   Si tiene un problema urgente y no puede comunicarse con nosotros, puede optar por buscar atencin mdica  en el consultorio de su doctor(a), en una clnica privada, en un centro de atencin urgente o en una sala de emergencias.  Si tiene Engineer, drilling, por favor llame inmediatamente al 911 o vaya a la sala de emergencias.  Nmeros de bper  - Dr. Gwen Pounds: [phone]  - Dra. Moye: [phone]  - Dra. Roseanne Reno: [phone]  En caso de inclemencias del [place], por favor llame a Lacy Duverney principal al [phone] para una actualizacin sobre el [place] de cualquier retraso o cierre.  Consejos para la medicacin en dermatologa: Por favor, guarde las  cajas en las que vienen los medicamentos de uso tpico para ayudarle a seguir las instrucciones sobre dnde y cmo usarlos. Las farmacias generalmente imprimen las instrucciones del medicamento slo en las cajas y no directamente en los tubos del [place].   Si su medicamento es muy caro, por favor, pngase en contacto con Rolm Gala llamando al [phone] y presione la opcin 4 o envenos un mensaje a travs de Clinical cytogeneticist.   No podemos decirle cul ser su copago por los medicamentos por adelantado ya que esto es diferente dependiendo de la cobertura de su seguro. Sin embargo, es posible que podamos encontrar un medicamento sustituto a Audiological scientist un formulario para que el seguro cubra el medicamento que se considera necesario.   Si se requiere una autorizacin previa para que su compaa de seguros [place] su medicamento, por favor permtanos de 1 a 2 das hbiles para completar [address]  Los precios de los medicamentos varan con frecuencia dependiendo del Environmental consultant de dnde se surte la receta  y alguna farmacias pueden ofrecer precios ms baratos.  El sitio web www.goodrx.com tiene cupones para medicamentos de Health and safety inspector. Los precios aqu no tienen en cuenta lo que podra costar con la ayuda del seguro (puede ser ms barato con su seguro), pero el sitio web puede darle el precio si no utiliz Tourist information centre manager.  - Puede imprimir el cupn correspondiente y llevarlo con su receta a la farmacia.  - Tambin puede pasar por nuestra oficina durante el horario de atencin regular y Education officer, museum una tarjeta de cupones de GoodRx.  - Si necesita que su receta se enve electrnicamente a una farmacia diferente, informe a nuestra oficina a travs de MyChart de [place] o por telfono llamando al [phone] y presione la opcin 4.

## 2022-06-03 ENCOUNTER — Encounter: Payer: Self-pay | Admitting: Dermatology

## 2022-06-30 ENCOUNTER — Ambulatory Visit: Payer: BC Managed Care – PPO | Admitting: Dermatology

## 2022-06-30 DIAGNOSIS — B079 Viral wart, unspecified: Secondary | ICD-10-CM | POA: Diagnosis not present

## 2022-06-30 DIAGNOSIS — L309 Dermatitis, unspecified: Secondary | ICD-10-CM | POA: Diagnosis not present

## 2022-06-30 NOTE — Progress Notes (Signed)
Follow-Up Visit   Subjective  Jacob Stewart is a 26 y.o. male who presents for the following: Warts (Patient here today for 1 month wart follow up. Warts at hands treated with LN2, Cantharidin Plus and Squaric Acid. Patient reacted to Squaric Acid at left upper arm 2 1/2 weeks after application. Warts at hands improved.).  The following portions of the chart were reviewed this encounter and updated as appropriate:   Tobacco  Allergies  Meds  Problems  Med Hx  Surg Hx  Fam Hx      Review of Systems:  No other skin or systemic complaints except as noted in HPI or Assessment and Plan.  Objective  Well appearing patient in no apparent distress; mood and affect are within normal limits.  A focused examination was performed including hands and fingers. Relevant physical exam findings are noted in the Assessment and Plan.  Left upper inner arm Scaly pink plaque  L-5 finger x 5, L- 5th lateral nail fold x 1, L-4 finger proximal nail fold x1, R-3 finger x1, left thumb subungual x2 (10) Verrucous papules -- Discussed viral etiology and contagion.     Assessment & Plan  Dermatitis Left upper inner arm  2ndary to sensitization to Squaric Acid  Sample of Eldred Manges given to patient to use twice a day to affected area at left upper inner arm until clear.  Lot # [id number]   Exp: 02/2023   Viral warts, unspecified type (10) L-5 finger x 5, L- 5th lateral nail fold x 1, L-4 finger proximal nail fold x1, R-3 finger x1, left thumb subungual x2  Discussed viral etiology and risk of spread.  Discussed multiple treatments may be required to clear warts.  Discussed possible post-treatment dyspigmentation and risk of recurrence.  Squaric Acid 3% applied to warts today. Prior to application reviewed risk of inflammation and irritation.  Cantharidin Plus is a blistering agent that comes from a beetle.  It needs to be washed off in about 4 hours after application.  Although it is painless when  applied in office, it may cause symptoms of mild pain and burning several hours later.  Treated areas will swell and turn red, and blisters may form.  Vaseline and a bandaid may be applied until wound has healed.  Once healed, the skin may remain temporarily discolored.  It can take weeks to months for pigmentation to return to normal.  Advised to wash off with soap and water in 4 hours or sooner if it becomes tender before then.   Destruction of lesion - L-5 finger x 5, L- 5th lateral nail fold x 1, L-4 finger proximal nail fold x1, R-3 finger x1, left thumb subungual x2  Destruction method: cryotherapy   Informed consent: discussed and consent obtained   Lesion destroyed using liquid nitrogen: Yes   Cryotherapy cycles:  2 Outcome: patient tolerated procedure well with no complications   Post-procedure details: wound care instructions given   Additional details:  Prior to procedure, discussed risks of blister formation, small wound, skin dyspigmentation, or rare scar following cryotherapy. Recommend Vaseline ointment to treated areas while healing.   Destruction of lesion - L-5 finger x 5, L- 5th lateral nail fold x 1, L-4 finger proximal nail fold x1, R-3 finger x1, left thumb subungual x2  Destruction method: chemical removal   Informed consent: discussed and consent obtained   Timeout:  patient name, date of birth, surgical site, and procedure verified Chemical destruction method: cantharidin   Application time:  2 hours Procedure instructions: patient instructed to wash and dry area   Outcome: patient tolerated procedure well with no complications   Post-procedure details: wound care instructions given   Additional details:  Patient advised to set alarm to remind them to wash off with soap and water at the directed time.   Return in about 4 weeks (around 07/28/2022) for Warts.  Graciella Belton, RMA, am acting as scribe for Forest Gleason, MD .  Documentation: I have reviewed the  above documentation for accuracy and completeness, and I agree with the above.  Forest Gleason, MD

## 2022-06-30 NOTE — Patient Instructions (Signed)
Instructions for After In-Office Application of Cantharidin  1. This is a strong medicine; please follow ALL instructions.  2. Gently wash off with soap and water in two hours or sooner s directed by your physician.  3. **WARNING** this medicine can cause severe blistering, blood blisters, infection, and/or scarring if it is not washed off as directed.  4. Your progress will be rechecked in 1-2 months; call sooner if there are any questions or problems.  Cantharidin Plus is a blistering agent that comes from a beetle.  It needs to be washed off in about 2 hours after application.  Although it is painless when applied in office, it may cause symptoms of mild pain and burning several hours later.  Treated areas will swell and turn red, and blisters may form.  Vaseline and a bandaid may be applied until wound has healed.  Once healed, the skin may remain temporarily discolored.  It can take weeks to months for pigmentation to return to normal.  Advised to wash off with soap and water in 4 hours or sooner if it becomes tender before then.  Cryotherapy Aftercare  Wash gently with soap and water everyday.   Apply Vaseline and Band-Aid daily until healed.   Due to recent changes in healthcare laws, you may see results of your pathology and/or laboratory studies on MyChart before the doctors have had a chance to review them. We understand that in some cases there may be results that are confusing or concerning to you. Please understand that not all results are received at the same time and often the doctors may need to interpret multiple results in order to provide you with the best plan of care or course of treatment. Therefore, we ask that you please give [place] 2 business days to thoroughly review all your results before contacting the office for clarification. Should we see a critical lab result, you will be contacted sooner.   If You Need Anything After Your Visit  If you have any questions or  concerns for your doctor, please call our main line at [phone] and press option 4 to reach your doctor's medical assistant. If no one answers, please leave a voicemail as directed and we will return your call as soon as possible. Messages left after 4 pm will be answered the following business day.   You may also send [place] a message via MyChart. We typically respond to MyChart messages within 1-2 business days.  For prescription refills, please ask your pharmacy to contact our office. Our fax number is [phone].  If you have an urgent issue when the clinic is closed that cannot wait until the next business day, you can page your doctor at the number below.    Please note that while we do our best to be available for urgent issues outside of office hours, we are not available 24/7.   If you have an urgent issue and are unable to reach [place], you may choose to seek medical care at your doctor's office, retail clinic, urgent care center, or emergency room.  If you have a medical emergency, please immediately call 911 or go to the emergency department.  Pager Numbers  - Dr. Gwen Pounds: [phone]  - Dr. Neale Burly: [phone]  - Dr. Roseanne Reno: [phone]  In the event of inclement weather, please call our main line at [phone] for an update on the status of any delays or closures.  Dermatology Medication Tips: Please keep the boxes that topical medications come in in  order to help keep track of the instructions about where and how to use these. Pharmacies typically print the medication instructions only on the boxes and not directly on the medication tubes.   If your medication is too expensive, please contact our office at [phone] option 4 or send [place] a message through MyChart.   We are unable to tell what your co-pay for medications will be in advance as this is different depending on your insurance coverage. However, we may be able to find a substitute medication at lower cost or  fill out paperwork to get insurance to cover a needed medication.   If a prior authorization is required to get your medication covered by your insurance company, please allow [place] 1-2 business days to complete this process.  Drug prices often vary depending on where the prescription is filled and some pharmacies may offer cheaper prices.  The website www.goodrx.com contains coupons for medications through different pharmacies. The prices here do not account for what the cost may be with help from insurance (it may be cheaper with your insurance), but the website can give you the price if you did not use any insurance.  - You can print the associated coupon and take it with your prescription to the pharmacy.  - You may also stop by our office during regular business hours and pick up a GoodRx coupon card.  - If you need your prescription sent electronically to a different pharmacy, notify our office through [facility] or by phone at [phone] option 4.     Si Usted Necesita Algo Despus de Su Visita  Tambin puede enviarnos un mensaje a travs de Clinical cytogeneticist. Por lo general respondemos a los mensajes de MyChart en el transcurso de 1 a 2 das hbiles.  Para renovar recetas, por favor pida a su farmacia que se ponga en contacto con nuestra oficina. Annie Sable de fax es [place] [phone].  Si tiene un asunto urgente cuando la clnica est cerrada y que no puede esperar hasta el siguiente da hbil, puede llamar/localizar a su doctor(a) al nmero que aparece a continuacin.   Por favor, tenga en cuenta que aunque hacemos todo lo posible para estar disponibles para asuntos urgentes fuera del horario de [place], no estamos disponibles las 24 horas del da, los 7 [address]  [address] de la [place].   Si tiene un problema urgente y no puede comunicarse con nosotros, puede optar por buscar atencin mdica  en el consultorio de su doctor(a), en una clnica privada, en un centro de atencin urgente o en una sala  de emergencias.  Si tiene Engineer, drilling, por favor llame inmediatamente al 911 o vaya a la sala de emergencias.  Nmeros de bper  - Dr. Gwen Pounds: [phone]  - Dra. Moye: [phone]  - Dra. Roseanne Reno: [phone]  En caso de inclemencias del [place], por favor llame a Lacy Duverney principal al [phone] para una actualizacin sobre el [place] de cualquier retraso o cierre.  Consejos para la medicacin en dermatologa: Por favor, guarde las cajas en las que vienen los medicamentos de uso tpico para ayudarle a seguir las instrucciones sobre dnde y cmo usarlos. Las farmacias generalmente imprimen las instrucciones del medicamento slo en las cajas y no directamente en los tubos del [place].   Si su medicamento es muy caro, por favor, pngase en contacto con Rolm Gala llamando al [phone] y presione la opcin 4 o envenos un mensaje a travs de Clinical cytogeneticist.   No podemos decirle cul ser su  copago por los medicamentos por adelantado ya que esto es diferente dependiendo de la cobertura de su seguro. Sin embargo, es posible que podamos encontrar un medicamento sustituto a Electrical engineer un formulario para que el seguro cubra el medicamento que se considera necesario.   Si se requiere una autorizacin previa para que su compaa de seguros [place] su medicamento, por favor permtanos de 1 a 2 das hbiles para completar este proceso.  Los precios de los medicamentos varan con frecuencia dependiendo del Environmental consultant de dnde se surte la receta y alguna farmacias pueden ofrecer precios ms baratos.  El sitio web www.goodrx.com tiene cupones para medicamentos de Airline pilot. Los precios aqu no tienen en cuenta lo que podra costar con la ayuda del seguro (puede ser ms barato con su seguro), pero el sitio web puede darle el precio si no utiliz Research scientist (physical sciences).  - Puede imprimir el cupn correspondiente y llevarlo con su receta a la farmacia.  - Tambin puede pasar  por nuestra oficina durante el horario de atencin regular y Charity fundraiser una tarjeta de cupones de GoodRx.  - Si necesita que su receta se enve electrnicamente a una farmacia diferente, informe a nuestra oficina a travs de MyChart de [place] o por telfono llamando al [phone] y presione la opcin 4.

## 2022-07-01 ENCOUNTER — Encounter: Payer: Self-pay | Admitting: Dermatology

## 2022-08-04 ENCOUNTER — Ambulatory Visit: Payer: BC Managed Care – PPO | Admitting: Dermatology

## 2022-08-10 ENCOUNTER — Other Ambulatory Visit: Payer: Self-pay

## 2022-08-10 ENCOUNTER — Emergency Department
Admission: EM | Admit: 2022-08-10 | Discharge: 2022-08-10 | Disposition: A | Discharge status: Home / Self Care | Payer: BC Managed Care – PPO | Attending: Student in an Organized Health Care Education/Training Program | Admitting: Student in an Organized Health Care Education/Training Program

## 2022-08-10 ENCOUNTER — Encounter: Payer: Self-pay | Admitting: *Deleted

## 2022-08-10 ENCOUNTER — Emergency Department: Payer: BC Managed Care – PPO

## 2022-08-10 DIAGNOSIS — Z20822 Contact with and (suspected) exposure to covid-19: Secondary | ICD-10-CM | POA: Diagnosis not present

## 2022-08-10 DIAGNOSIS — J40 Bronchitis, not specified as acute or chronic: Secondary | ICD-10-CM | POA: Diagnosis not present

## 2022-08-10 DIAGNOSIS — R059 Cough, unspecified: Secondary | ICD-10-CM | POA: Diagnosis present

## 2022-08-10 DIAGNOSIS — M791 Myalgia, unspecified site: Secondary | ICD-10-CM | POA: Insufficient documentation

## 2022-08-10 LAB — CBC
HCT: 46.9 % (ref 39.0–52.0)
Hemoglobin: 14.9 g/dL (ref 13.0–17.0)
MCH: 25.9 pg — ABNORMAL LOW (ref 26.0–34.0)
MCHC: 31.8 g/dL (ref 30.0–36.0)
MCV: 81.6 fL (ref 80.0–100.0)
Platelets: 299 10*3/uL (ref 150–400)
RBC: 5.75 MIL/uL (ref 4.22–5.81)
RDW: 12 % (ref 11.5–15.5)
WBC: 7.3 10*3/uL (ref 4.0–10.5)
nRBC: 0 % (ref 0.0–0.2)

## 2022-08-10 LAB — BASIC METABOLIC PANEL
Anion gap: 6 (ref 5–15)
BUN: 12 mg/dL (ref 6–20)
CO2: 31 mmol/L (ref 22–32)
Calcium: 9.4 mg/dL (ref 8.9–10.3)
Chloride: 105 mmol/L (ref 98–111)
Creatinine, Ser: 0.9 mg/dL (ref 0.61–1.24)
GFR, Estimated: >60 mL/min (ref >60)
Glucose, Bld: 81 mg/dL (ref 70–99)
Potassium: 4.3 mmol/L (ref 3.5–5.1)
Sodium: 142 mmol/L (ref 135–145)

## 2022-08-10 LAB — RESP PANEL BY RT-PCR (FLU A&B, COVID) ARPGX2
Influenza A by PCR: NEGATIVE
Influenza B by PCR: NEGATIVE
SARS Coronavirus 2 by RT PCR: NEGATIVE

## 2022-08-10 LAB — TROPONIN I (HIGH SENSITIVITY): Troponin I (High Sensitivity): 3 ng/L (ref <18)

## 2022-08-10 MED ORDER — ALBUTEROL SULFATE HFA 108 (90 BASE) MCG/ACT IN AERS: 2.0000 | INHALATION_SPRAY | Freq: Four times a day (QID) | RESPIRATORY_TRACT | 2 refills | Status: AC | PRN

## 2022-08-10 MED ORDER — PREDNISONE 20 MG PO TABS: 40.0000 mg | ORAL_TABLET | Freq: Every day | ORAL | 0 refills | Status: AC

## 2022-08-10 MED ORDER — DEXAMETHASONE 4 MG PO TABS
10.0000 mg | ORAL_TABLET | Freq: Once | ORAL | Status: AC
  Administered 2022-08-10: 10 mg via ORAL
  Filled 2022-08-10: qty 3

## 2022-08-10 MED ORDER — IPRATROPIUM-ALBUTEROL 0.5-2.5 (3) MG/3ML IN SOLN
3.0000 mL | Freq: Once | RESPIRATORY_TRACT | Status: AC
  Administered 2022-08-10: 3 mL via RESPIRATORY_TRACT
  Filled 2022-08-10: qty 3

## 2022-08-10 NOTE — ED Triage Notes (Signed)
Pt with fever and cough.  Pt taking zithromax.  Negative for covid this week.  Pt has sob.  No chest pain.  Pt alert  speech clear.

## 2022-08-10 NOTE — ED Provider Notes (Signed)
[facility] Provider Note    Event Date/Time   First MD Initiated Contact with Patient 08/10/22 1540     (approximate)   History   Fever and Cough   HPI  Jacob Stewart is a 26 y.o. male who presents to the ER for evaluation of cough myalgias fevers for the past several days.  Went to urgent care and completed a course of azithromycin but still having persistent cough fevers does not feel like he is getting better.  Denies any history of asthma.  Does have family history of bronchitis.  Does not smoke.     Physical Exam   Triage Vital Signs: ED Triage Vitals  Enc Vitals Group     BP 08/10/22 1522 (!) 140/91     Pulse Rate 08/10/22 1522 92     Resp 08/10/22 1522 20     Temp 08/10/22 1522 97.9 F (36.6 C)     Temp Source 08/10/22 1522 Oral     SpO2 08/10/22 1522 99 %     Weight 08/10/22 1523 180 lb (81.6 kg)     Height 08/10/22 1523 6\' 1"  (1.854 m)     Head Circumference --      Peak Flow --      Pain Score 08/10/22 1523 0     Pain Loc --      Pain Edu? --      Excl. in GC? --     Most recent vital signs: Vitals:   08/10/22 1522  BP: (!) 140/91  Pulse: 92  Resp: 20  Temp: 97.9 F (36.6 C)  SpO2: 99%     Constitutional: Alert  Eyes: Conjunctivae are normal.  Head: Atraumatic. Nose: No congestion/rhinnorhea. Mouth/Throat: Mucous membranes are moist.   Neck: Painless ROM.  Cardiovascular:   Good peripheral circulation. Respiratory: Normal respiratory effort.  No retractions.  Gastrointestinal: Soft and nontender.  Musculoskeletal:  no deformity Neurologic:  MAE spontaneously. No gross focal neurologic deficits are appreciated.  Skin:  Skin is warm, dry and intact. No rash noted. Psychiatric: Mood and affect are normal. Speech and behavior are normal.    ED Results / Procedures / Treatments   Labs (all labs ordered are listed, but only abnormal results are displayed) Labs Reviewed  CBC - Abnormal; Notable for the  following components:      Result Value   MCH 25.9 (*)    All other components within normal limits  RESP PANEL BY RT-PCR (FLU A&B, COVID) ARPGX2  BASIC METABOLIC PANEL  TROPONIN I (HIGH SENSITIVITY)     EKG  ED ECG REPORT I, 08/12/22, the attending physician, personally viewed and interpreted this ECG.   Date: 08/10/2022  EKG Time: 15:29  Rate: 85  Rhythm: sinus  Axis: normal  Intervals: normal  ST&T Change: no stemi, no depressions    RADIOLOGY Please see ED Course for my review and interpretation.  I personally reviewed all radiographic images ordered to evaluate for the above acute complaints and reviewed radiology reports and findings.  These findings were personally discussed with the patient.  Please see medical record for radiology report.    PROCEDURES:  Critical Care performed: No  Procedures   MEDICATIONS ORDERED IN ED: Medications  ipratropium-albuterol (DUONEB) 0.5-2.5 (3) MG/3ML nebulizer solution 3 mL (3 mLs Nebulization Given 08/10/22 1716)  dexamethasone (DECADRON) tablet 10 mg (10 mg Oral Given 08/10/22 1716)     IMPRESSION / MDM / ASSESSMENT AND PLAN / ED COURSE  I  reviewed the triage vital signs and the nursing notes.                              Differential diagnosis includes, but is not limited to, bronchitis, pneumonia, pneumothorax, COVID, flu, asthma  Patient presenting to the ER for evaluation of symptoms as described above.  Based on symptoms, risk factors and considered above differential, this presenting complaint could reflect a potentially life-threatening illness therefore the patient will be placed on continuous pulse oximetry and telemetry for monitoring.  Laboratory evaluation will be sent to evaluate for the above complaints.  Patient  nontoxic-appearing does appear that has been under the weather for several days.  Will check blood work as well as COVID test order chest x-ray.  Will give nebulizer.    Clinical  Course as of 08/10/22 1755  Wed Aug 10, 2022  1549 CXR on my review and interpretation without evidence of consolidation. [PR]  1755 Patient felt significant improvement after nebulizer therefore do suspect component of bronchitis.  His blood work is otherwise reassuring.  Does appear stable and appropriate for outpatient follow-up. [PR]    Clinical Course User Index [PR] Willy Eddy, MD     FINAL CLINICAL IMPRESSION(S) / ED DIAGNOSES   Final diagnoses:  Bronchitis     Rx / DC Orders   ED Discharge Orders          Ordered    predniSONE (DELTASONE) 20 MG tablet  Daily        08/10/22 1733    albuterol (VENTOLIN HFA) 108 (90 Base) MCG/ACT inhaler  Every 6 hours PRN        08/10/22 1733             Note:  This document was prepared using Dragon voice recognition software and may include unintentional dictation errors.    Willy Eddy, MD 08/10/22 [phone]

## 2022-09-05 ENCOUNTER — Encounter: Payer: Self-pay | Admitting: Dermatology

## 2022-09-05 ENCOUNTER — Ambulatory Visit (INDEPENDENT_AMBULATORY_CARE_PROVIDER_SITE_OTHER): Payer: BC Managed Care – PPO | Admitting: Dermatology

## 2022-09-05 VITALS — BP 122/68 | HR 72

## 2022-09-05 DIAGNOSIS — B078 Other viral warts: Secondary | ICD-10-CM

## 2022-09-05 MED ORDER — IMIQUIMOD 5 % EX CREA: TOPICAL_CREAM | CUTANEOUS | 3 refills | Status: AC

## 2022-09-05 NOTE — Progress Notes (Signed)
Follow-Up Visit   Subjective  Jacob Stewart is a 26 y.o. male who presents for the following: Warts (Fingers and thumb. 2 month follow up. Hx of LN2, squaric acid and cantharone plus treatment).  The following portions of the chart were reviewed this encounter and updated as appropriate:  Tobacco  Allergies  Meds  Problems  Med Hx  Surg Hx  Fam Hx      Review of Systems: No other skin or systemic complaints except as noted in HPI or Assessment and Plan.   Objective  Well appearing patient in no apparent distress; mood and affect are within normal limits.  A focused examination was performed including B/L hands, fingers. Relevant physical exam findings are noted in the Assessment and Plan.  L-5 finger x2,  periungual L-5 x2, L-4 finger x2, L thumb x2 periungual (8) Verrucous papules -- Discussed viral etiology and contagion.     Assessment & Plan  Other viral warts (8) L-5 finger x2,  periungual L-5 x2, L-4 finger x2, L thumb x2 periungual  Viral Wart (HPV) Counseling  Discussed viral / HPV (Human Papilloma Virus) etiology and risk of spread /infectivity to other areas of body as well as to other people.  Multiple treatments and methods may be required to clear warts and it is possible treatment may not be successful.  Treatment risks include discoloration; scarring and there is still potential for wart recurrence.  Continue WartPEEL (5-fluorouracil/salicylic acid) decreasing to every other night. Add imiquimod every other night under occlusion, alternating with WartPeel.  Paring performed at L4, L5 fingers  NDC [phone] Candida  Cantharidin Plus is a blistering agent that comes from a beetle.  It needs to be washed off in about 4 hours after application.  Although it is painless when applied in office, it may cause symptoms of mild pain and burning several hours later.  Treated areas will swell and turn red, and blisters may form.  Vaseline and a bandaid may be  applied until wound has healed.  Once healed, the skin may remain temporarily discolored.  It can take weeks to months for pigmentation to return to normal.  Advised to wash off with soap and water in 4 hours or sooner if it becomes tender before then.  CANDIDA ALBICANS IMMUNOTHERAPY OF WARTS INSTRUCTIONS   Candida immunotherapy injections for warts are usually quite safe and tolerated well by most people.However, mild to moderate itching, tenderness or swelling at the injection site is common and usually lasts 24 to 48 hours. If you are uncomfortable, you may:  Elevate the painful area Apply ice wrapped in a towel for five minutes on and five minutes off Take Tylenol every 4-6 hours (NOT ibuprofen, aspirin, Advil, Aleve or Motrin due to the anti-inflammatory properties, since the goal is to generate an inflammatory reaction to kill the wart virus)  Only 5% of Candida immunotherapy patients get flu-like symptoms (such as muscle and joint aches, chills, or headaches). Rarely, fever can also occur. Usually these symptoms last only 24 - 48 hours. If any of these symptoms occur, taking Tylenol every four to six hours really helps. This can be prevented in subsequent office visits by taking Tylenol BEFORE your next injection.(Remember, DO NOT take ibuprofen, aspirin, Advil, Aleve or Motrin).  Rarely,  widespread hives (itchy welts) may occur from this treatment. If this occurs, take oral Benadryl (may make drowsy) every four to six hours as needed for welts /hives.  Alternatively, you may take a non-sedating antihistamine such  as Claritin, Allegra or Zyrtec for welts/hives.  Our phone number is [phone]. If you have any serious or persistent problems, please contact our office for further directions.    Destruction of lesion - L-5 finger x2,  periungual L-5 x2, L-4 finger x2, L thumb x2 periungual  Destruction method: cryotherapy   Informed consent: discussed and consent obtained   Lesion  destroyed using liquid nitrogen: Yes   Region frozen until ice ball extended beyond lesion: Yes   Outcome: patient tolerated procedure well with no complications   Post-procedure details: wound care instructions given   Additional details:  Prior to procedure, discussed risks of blister formation, small wound, skin dyspigmentation, or rare scar following cryotherapy. Recommend Vaseline ointment to treated areas while healing.   Destruction of lesion - L-5 finger x2,  periungual L-5 x2, L-4 finger x2, L thumb x2 periungual  Destruction method: chemical removal   Informed consent: discussed and consent obtained   Timeout:  patient name, date of birth, surgical site, and procedure verified Chemical destruction method comment:  Cantharone plus Procedure instructions: patient instructed to wash and dry area   Outcome: patient tolerated procedure well with no complications   Post-procedure details: wound care instructions given   Additional details:  Squaric Acid 3% applied to warts today. Prior to application reviewed risk of inflammation and irritation.   Intralesional injection - L-5 finger x2,  periungual L-5 x2, L-4 finger x2, L thumb x2 periungual Location: L-5, L-4 fingers  Informed Consent: Discussed risks (infection, pain, bleeding, bruising, thinning of the skin, loss of skin pigment, lack of resolution, and recurrence of lesion) and benefits of the procedure, as well as the alternatives. Informed consent was obtained. Preparation: The area was prepared a standard fashion.  Anesthesia:Lidocaine 1% with epinephrine  Procedure Details: An intralesional injection was performed with candida antigen. 0.1 cc in total were injected.  Total number of injections: 5  Plan: The patient was instructed on post-op care. Recommend OTC analgesia as needed for pain.  NDC:   imiquimod (ALDARA) 5 % cream - L-5 finger x2,  periungual L-5 x2, L-4 finger x2, L thumb x2 periungual Apply every other  night at bedtime to affected areas, cover with occlusion.   Return in about 4 weeks (around 10/03/2022) for Wart Follow UP.  I, Lawson Radar, CMA, am acting as scribe for Darden Dates, MD.  Documentation: I have reviewed the above documentation for accuracy and completeness, and I agree with the above.  Darden Dates, MD

## 2022-09-05 NOTE — Patient Instructions (Addendum)
Cryotherapy Aftercare  Wash gently with soap and water everyday.   Apply Vaseline and Band-Aid daily until healed.    Continue WartPEEL every other night at bedtime, alternating with Imiquimod.   Start Imiquimod every other night under occlusion to warts.     Instructions for After In-Office Application of Cantharidin  1. This is a strong medicine; please follow ALL instructions.  2. Gently wash off with soap and water in four hours or sooner s directed by your physician.  3. **WARNING** this medicine can cause severe blistering, blood blisters, infection, and/or scarring if it is not washed off as directed.  4. Your progress will be rechecked in 1-2 months; call sooner if there are any questions or problems.    Cantharidin Plus is a blistering agent that comes from a beetle.  It needs to be washed off in about 4 hours after application.  Although it is painless when applied in office, it may cause symptoms of mild pain and burning several hours later.  Treated areas will swell and turn red, and blisters may form.  Vaseline and a bandaid may be applied until wound has healed.  Once healed, the skin may remain temporarily discolored.  It can take weeks to months for pigmentation to return to normal.  Advised to wash off with soap and water in 4 hours or sooner if it becomes tender before then.   Due to recent changes in healthcare laws, you may see results of your pathology and/or laboratory studies on MyChart before the doctors have had a chance to review them. We understand that in some cases there may be results that are confusing or concerning to you. Please understand that not all results are received at the same time and often the doctors may need to interpret multiple results in order to provide you with the best plan of care or course of treatment. Therefore, we ask that you please give [place] 2 business days to thoroughly review all your results before contacting the office for  clarification. Should we see a critical lab result, you will be contacted sooner.   If You Need Anything After Your Visit  If you have any questions or concerns for your doctor, please call our main line at [phone] and press option 4 to reach your doctor's medical assistant. If no one answers, please leave a voicemail as directed and we will return your call as soon as possible. Messages left after 4 pm will be answered the following business day.   You may also send [place] a message via [place]. We typically respond to MyChart messages within 1-2 business days.  For prescription refills, please ask your pharmacy to contact our office. Our fax number is [phone].  If you have an urgent issue when the clinic is closed that cannot wait until the next business day, you can page your doctor at the number below.    Please note that while we do our best to be available for urgent issues outside of office hours, we are not available 24/7.   If you have an urgent issue and are unable to reach [place], you may choose to seek medical care at your doctor's office, retail clinic, urgent care center, or emergency room.  If you have a medical emergency, please immediately call 911 or go to the emergency department.  Pager Numbers  - Dr. Nehemiah Massed: [phone]  - Dr. Laurence Ferrari: [phone]  - Dr. Nicole Kindred: [phone]  In the event of inclement weather, please call our  main line at [phone] for an update on the status of any delays or closures.  Dermatology Medication Tips: Please keep the boxes that topical medications come in in order to help keep track of the instructions about where and how to use these. Pharmacies typically print the medication instructions only on the boxes and not directly on the medication tubes.   If your medication is too expensive, please contact our office at [phone] option 4 or send [place] a message through [place].   We are unable to tell what your co-pay for  medications will be in advance as this is different depending on your insurance coverage. However, we may be able to find a substitute medication at lower cost or fill out paperwork to get insurance to cover a needed medication.   If a prior authorization is required to get your medication covered by your insurance company, please allow [place] 1-2 business days to complete this process.  Drug prices often vary depending on where the prescription is filled and some pharmacies may offer cheaper prices.  The website www.goodrx.com contains coupons for medications through different pharmacies. The prices here do not account for what the cost may be with help from insurance (it may be cheaper with your insurance), but the website can give you the price if you did not use any insurance.  - You can print the associated coupon and take it with your prescription to the pharmacy.  - You may also stop by our office during regular business hours and pick up a GoodRx coupon card.  - If you need your prescription sent electronically to a different pharmacy, notify our office through [facility] or by phone at [phone] option 4.     Si Usted Necesita Algo Despus de Su Visita  Tambin puede enviarnos un mensaje a travs de Pharmacist, community. Por lo general respondemos a los mensajes de MyChart en el transcurso de 1 a 2 das hbiles.  Para renovar recetas, por favor pida a su farmacia que se ponga en contacto con nuestra oficina. Harland Dingwall de fax es [place] [phone].  Si tiene un asunto urgente cuando la clnica est cerrada y que no puede esperar hasta el siguiente da hbil, puede llamar/localizar a su doctor(a) al nmero que aparece a continuacin.   Por favor, tenga en cuenta que aunque hacemos todo lo posible para estar disponibles para asuntos urgentes fuera del horario de [place], no estamos disponibles las 24 horas del da, los 7 das de la [place].   Si tiene un problema urgente y no puede  comunicarse con nosotros, puede optar por buscar atencin mdica  en el consultorio de su doctor(a), en una clnica privada, en un centro de atencin urgente o en una sala de emergencias.  Si tiene Engineering geologist, por favor llame inmediatamente al 911 o vaya a la sala de emergencias.  Nmeros de bper  - Dr. Nehemiah Massed: [phone]  - Dra. Keith Cancio: [phone]  - Dra. Nicole Kindred: [phone]  En caso de inclemencias del [place], por favor llame a Johnsie Kindred principal al [phone] para una actualizacin sobre el [place] de cualquier retraso o cierre.  Consejos para la medicacin en dermatologa: Por favor, guarde las cajas en las que vienen los medicamentos de uso tpico para ayudarle a seguir las instrucciones sobre dnde y cmo usarlos. Las farmacias generalmente imprimen las instrucciones del medicamento slo en las cajas y no directamente en los tubos del [place].   Si su medicamento es muy caro, por favor, pngase  en contacto con nuestra oficina llamando al [phone] y presione la opcin 4 o envenos un mensaje a travs de Clinical cytogeneticist.   No podemos decirle cul ser su copago por los medicamentos por adelantado ya que esto es diferente dependiendo de la cobertura de su seguro. Sin embargo, es posible que podamos encontrar un medicamento sustituto a Audiological scientist un formulario para que el seguro cubra el medicamento que se considera necesario.   Si se requiere una autorizacin previa para que su compaa de seguros [place] su medicamento, por favor permtanos de 1 a 2 das hbiles para completar [address]  Los precios de los medicamentos varan con frecuencia dependiendo del Environmental consultant de dnde se surte la receta y alguna farmacias pueden ofrecer precios ms baratos.  El sitio web www.goodrx.com tiene cupones para medicamentos de Health and safety inspector. Los precios aqu no tienen en cuenta lo que podra costar con la ayuda del seguro (puede ser ms barato con su seguro), pero  el sitio web puede darle el precio si no utiliz Tourist information centre manager.  - Puede imprimir el cupn correspondiente y llevarlo con su receta a la farmacia.  - Tambin puede pasar por nuestra oficina durante el horario de atencin regular y Education officer, museum una tarjeta de cupones de GoodRx.  - Si necesita que su receta se enve electrnicamente a una farmacia diferente, informe a nuestra oficina a travs de MyChart de [place] o por telfono llamando al [phone] y presione la opcin 4.

## 2022-10-05 ENCOUNTER — Ambulatory Visit (INDEPENDENT_AMBULATORY_CARE_PROVIDER_SITE_OTHER): Payer: BC Managed Care – PPO | Admitting: Dermatology

## 2022-10-05 DIAGNOSIS — Z538 Procedure and treatment not carried out for other reasons: Secondary | ICD-10-CM

## 2022-10-05 NOTE — Progress Notes (Signed)
Note entered in error. Visit canceled due to insurance.

## 2022-10-05 NOTE — Patient Instructions (Signed)
Due to recent changes in healthcare laws, you may see results of your pathology and/or laboratory studies on MyChart before the doctors have had a chance to review them. We understand that in some cases there may be results that are confusing or concerning to you. Please understand that not all results are received at the same time and often the doctors may need to interpret multiple results in order to provide you with the best plan of care or course of treatment. Therefore, we ask that you please give us 2 business days to thoroughly review all your results before contacting the office for clarification. Should we see a critical lab result, you will be contacted sooner.   If You Need Anything After Your Visit  If you have any questions or concerns for your doctor, please call our main line at [phone] and press option 4 to reach your doctor's medical assistant. If no one answers, please leave a voicemail as directed and we will return your call as soon as possible. Messages left after 4 pm will be answered the following business day.   You may also send us a message via MyChart. We typically respond to MyChart messages within 1-2 business days.  For prescription refills, please ask your pharmacy to contact our office. Our fax number is [phone].  If you have an urgent issue when the clinic is closed that cannot wait until the next business day, you can page your doctor at the number below.    Please note that while we do our best to be available for urgent issues outside of office hours, we are not available 24/7.   If you have an urgent issue and are unable to reach us, you may choose to seek medical care at your doctor's office, retail clinic, urgent care center, or emergency room.  If you have a medical emergency, please immediately call 911 or go to the emergency department.  Pager Numbers  - Dr. Kowalski: [phone]  - Dr. Moye: [phone]  - Dr. Stewart:  [phone]  In the event of inclement weather, please call our main line at [phone] for an update on the status of any delays or closures.  Dermatology Medication Tips: Please keep the boxes that topical medications come in in order to help keep track of the instructions about where and how to use these. Pharmacies typically print the medication instructions only on the boxes and not directly on the medication tubes.   If your medication is too expensive, please contact our office at [phone] option 4 or send us a message through MyChart.   We are unable to tell what your co-pay for medications will be in advance as this is different depending on your insurance coverage. However, we may be able to find a substitute medication at lower cost or fill out paperwork to get insurance to cover a needed medication.   If a prior authorization is required to get your medication covered by your insurance company, please allow us 1-2 business days to complete this process.  Drug prices often vary depending on where the prescription is filled and some pharmacies may offer cheaper prices.  The website www.goodrx.com contains coupons for medications through different pharmacies. The prices here do not account for what the cost may be with help from insurance (it may be cheaper with your insurance), but the website can give you the price if you did not use any insurance.  - You can print the associated coupon and take it with   your prescription to the pharmacy.  - You may also stop by our office during regular business hours and pick up a GoodRx coupon card.  - If you need your prescription sent electronically to a different pharmacy, notify our office through [facility] MyChart or by phone at [phone] option 4.     Si Usted Necesita Algo Despus de Su Visita  Tambin puede enviarnos un mensaje a travs de MyChart. Por lo general respondemos a los mensajes de MyChart en el transcurso de 1 a 2  das hbiles.  Para renovar recetas, por favor pida a su farmacia que se ponga en contacto con nuestra oficina. Nuestro nmero de fax es el [phone].  Si tiene un asunto urgente cuando la clnica est cerrada y que no puede esperar hasta el siguiente da hbil, puede llamar/localizar a su doctor(a) al nmero que aparece a continuacin.   Por favor, tenga en cuenta que aunque hacemos todo lo posible para estar disponibles para asuntos urgentes fuera del horario de oficina, no estamos disponibles las 24 horas del da, los 7 das de la semana.   Si tiene un problema urgente y no puede comunicarse con nosotros, puede optar por buscar atencin mdica  en el consultorio de su doctor(a), en una clnica privada, en un centro de atencin urgente o en una sala de emergencias.  Si tiene una emergencia mdica, por favor llame inmediatamente al 911 o vaya a la sala de emergencias.  Nmeros de bper  - Dr. Kowalski: [phone]  - Dra. Moye: [phone]  - Dra. Stewart: [phone]  En caso de inclemencias del tiempo, por favor llame a nuestra lnea principal al [phone] para una actualizacin sobre el estado de cualquier retraso o cierre.  Consejos para la medicacin en dermatologa: Por favor, guarde las cajas en las que vienen los medicamentos de uso tpico para ayudarle a seguir las instrucciones sobre dnde y cmo usarlos. Las farmacias generalmente imprimen las instrucciones del medicamento slo en las cajas y no directamente en los tubos del medicamento.   Si su medicamento es muy caro, por favor, pngase en contacto con nuestra oficina llamando al [phone] y presione la opcin 4 o envenos un mensaje a travs de MyChart.   No podemos decirle cul ser su copago por los medicamentos por adelantado ya que esto es diferente dependiendo de la cobertura de su seguro. Sin embargo, es posible que podamos encontrar un medicamento sustituto a menor costo o llenar un formulario para que el  seguro cubra el medicamento que se considera necesario.   Si se requiere una autorizacin previa para que su compaa de seguros cubra su medicamento, por favor permtanos de 1 a 2 das hbiles para completar este proceso.  Los precios de los medicamentos varan con frecuencia dependiendo del lugar de dnde se surte la receta y alguna farmacias pueden ofrecer precios ms baratos.  El sitio web www.goodrx.com tiene cupones para medicamentos de diferentes farmacias. Los precios aqu no tienen en cuenta lo que podra costar con la ayuda del seguro (puede ser ms barato con su seguro), pero el sitio web puede darle el precio si no utiliz ningn seguro.  - Puede imprimir el cupn correspondiente y llevarlo con su receta a la farmacia.  - Tambin puede pasar por nuestra oficina durante el horario de atencin regular y recoger una tarjeta de cupones de GoodRx.  - Si necesita que su receta se enve electrnicamente a una farmacia diferente, informe a nuestra oficina a travs de MyChart de    o por telfono llamando al [phone] y presione la opcin 4.  

## 2022-11-08 ENCOUNTER — Ambulatory Visit: Payer: BC Managed Care – PPO | Admitting: Dermatology

## 2022-11-15 ENCOUNTER — Ambulatory Visit: Payer: BC Managed Care – PPO | Admitting: Dermatology
